# Patient Record
Sex: Female | Born: 1980 | Race: White | Hispanic: No | Marital: Married | Smoking: Former smoker
Health system: Southern US, Community
[De-identification: ages and names within clinical notes are randomized; demographics above are authoritative.]

## PROBLEM LIST (undated history)

## (undated) DIAGNOSIS — J309 Allergic rhinitis, unspecified: Secondary | ICD-10-CM

## (undated) DIAGNOSIS — K219 Gastro-esophageal reflux disease without esophagitis: Secondary | ICD-10-CM

## (undated) DIAGNOSIS — M62830 Muscle spasm of back: Secondary | ICD-10-CM

## (undated) DIAGNOSIS — I493 Ventricular premature depolarization: Secondary | ICD-10-CM

## (undated) DIAGNOSIS — R200 Anesthesia of skin: Secondary | ICD-10-CM

## (undated) DIAGNOSIS — R7303 Prediabetes: Secondary | ICD-10-CM

## (undated) DIAGNOSIS — N6019 Diffuse cystic mastopathy of unspecified breast: Secondary | ICD-10-CM

## (undated) DIAGNOSIS — R030 Elevated blood-pressure reading, without diagnosis of hypertension: Secondary | ICD-10-CM

## (undated) DIAGNOSIS — Z9289 Personal history of other medical treatment: Secondary | ICD-10-CM

## (undated) DIAGNOSIS — F418 Other specified anxiety disorders: Secondary | ICD-10-CM

## (undated) DIAGNOSIS — R202 Paresthesia of skin: Secondary | ICD-10-CM

## (undated) DIAGNOSIS — E669 Obesity, unspecified: Secondary | ICD-10-CM

## (undated) DIAGNOSIS — F410 Panic disorder [episodic paroxysmal anxiety] without agoraphobia: Secondary | ICD-10-CM

## (undated) DIAGNOSIS — J45909 Unspecified asthma, uncomplicated: Secondary | ICD-10-CM

## (undated) DIAGNOSIS — Z90710 Acquired absence of both cervix and uterus: Secondary | ICD-10-CM

## (undated) DIAGNOSIS — E785 Hyperlipidemia, unspecified: Secondary | ICD-10-CM

## (undated) HISTORY — DX: Gastro-esophageal reflux disease without esophagitis: K21.9

## (undated) HISTORY — DX: Ventricular premature depolarization: I49.3

## (undated) HISTORY — DX: Obesity, unspecified: E66.9

## (undated) HISTORY — DX: Allergic rhinitis, unspecified: J30.9

## (undated) HISTORY — DX: Paresthesia of skin: R20.2

## (undated) HISTORY — DX: Prediabetes: R73.03

## (undated) HISTORY — DX: Personal history of other medical treatment: Z92.89

## (undated) HISTORY — DX: Hyperlipidemia, unspecified: E78.5

## (undated) HISTORY — PX: DILATION AND CURETTAGE OF UTERUS: SHX78

## (undated) HISTORY — DX: Unspecified asthma, uncomplicated: J45.909

## (undated) HISTORY — DX: Anesthesia of skin: R20.0

## (undated) HISTORY — DX: Acquired absence of both cervix and uterus: Z90.710

## (undated) HISTORY — DX: Diffuse cystic mastopathy of unspecified breast: N60.19

## (undated) HISTORY — DX: Panic disorder (episodic paroxysmal anxiety): F41.0

## (undated) HISTORY — DX: Other specified anxiety disorders: F41.8

## (undated) HISTORY — DX: Elevated blood-pressure reading, without diagnosis of hypertension: R03.0

## (undated) HISTORY — PX: ABDOMINAL HYSTERECTOMY: SHX81

## (undated) HISTORY — DX: Muscle spasm of back: M62.830

---

## 1999-06-25 ENCOUNTER — Inpatient Hospital Stay (HOSPITAL_COMMUNITY): Admission: AD | Admit: 1999-06-25 | Discharge: 1999-06-27 | Payer: Self-pay | Admitting: Obstetrics and Gynecology

## 1999-07-30 ENCOUNTER — Other Ambulatory Visit: Admission: RE | Admit: 1999-07-30 | Discharge: 1999-07-30 | Payer: Self-pay | Admitting: Obstetrics and Gynecology

## 2000-05-30 ENCOUNTER — Other Ambulatory Visit: Admission: RE | Admit: 2000-05-30 | Discharge: 2000-05-30 | Payer: Self-pay | Admitting: Obstetrics & Gynecology

## 2000-08-16 ENCOUNTER — Ambulatory Visit (HOSPITAL_COMMUNITY): Admission: RE | Admit: 2000-08-16 | Discharge: 2000-08-16 | Payer: Self-pay | Admitting: *Deleted

## 2000-12-14 ENCOUNTER — Inpatient Hospital Stay (HOSPITAL_COMMUNITY): Admission: AD | Admit: 2000-12-14 | Discharge: 2000-12-16 | Payer: Self-pay | Admitting: Obstetrics & Gynecology

## 2000-12-31 ENCOUNTER — Inpatient Hospital Stay (HOSPITAL_COMMUNITY): Admission: AD | Admit: 2000-12-31 | Discharge: 2000-12-31 | Payer: Self-pay | Admitting: Obstetrics and Gynecology

## 2001-01-13 ENCOUNTER — Other Ambulatory Visit: Admission: RE | Admit: 2001-01-13 | Discharge: 2001-01-13 | Payer: Self-pay | Admitting: Obstetrics & Gynecology

## 2001-05-02 ENCOUNTER — Emergency Department (HOSPITAL_COMMUNITY): Admission: EM | Admit: 2001-05-02 | Discharge: 2001-05-02 | Payer: Self-pay | Admitting: Emergency Medicine

## 2003-03-22 ENCOUNTER — Encounter: Payer: Self-pay | Admitting: Obstetrics and Gynecology

## 2003-03-22 ENCOUNTER — Encounter: Admission: RE | Admit: 2003-03-22 | Discharge: 2003-03-22 | Payer: Self-pay | Admitting: Obstetrics and Gynecology

## 2003-07-26 ENCOUNTER — Emergency Department (HOSPITAL_COMMUNITY): Admission: EM | Admit: 2003-07-26 | Discharge: 2003-07-26 | Payer: Self-pay | Admitting: Emergency Medicine

## 2003-07-26 ENCOUNTER — Encounter: Payer: Self-pay | Admitting: Emergency Medicine

## 2004-01-26 ENCOUNTER — Emergency Department (HOSPITAL_COMMUNITY): Admission: EM | Admit: 2004-01-26 | Discharge: 2004-01-26 | Payer: Self-pay | Admitting: Emergency Medicine

## 2004-10-09 ENCOUNTER — Other Ambulatory Visit: Admission: RE | Admit: 2004-10-09 | Discharge: 2004-10-09 | Payer: Self-pay | Admitting: Obstetrics and Gynecology

## 2005-02-01 ENCOUNTER — Emergency Department (HOSPITAL_COMMUNITY): Admission: EM | Admit: 2005-02-01 | Discharge: 2005-02-01 | Payer: Self-pay | Admitting: Emergency Medicine

## 2005-04-29 ENCOUNTER — Ambulatory Visit (HOSPITAL_COMMUNITY): Admission: RE | Admit: 2005-04-29 | Discharge: 2005-04-29 | Payer: Self-pay | Admitting: Obstetrics and Gynecology

## 2005-04-29 ENCOUNTER — Encounter (INDEPENDENT_AMBULATORY_CARE_PROVIDER_SITE_OTHER): Payer: Self-pay | Admitting: *Deleted

## 2005-07-28 ENCOUNTER — Encounter: Admission: RE | Admit: 2005-07-28 | Discharge: 2005-07-28 | Payer: Self-pay | Admitting: Family Medicine

## 2006-04-24 ENCOUNTER — Emergency Department (HOSPITAL_COMMUNITY): Admission: EM | Admit: 2006-04-24 | Discharge: 2006-04-24 | Payer: Self-pay | Admitting: Emergency Medicine

## 2006-06-03 ENCOUNTER — Other Ambulatory Visit: Admission: RE | Admit: 2006-06-03 | Discharge: 2006-06-03 | Payer: Self-pay | Admitting: Obstetrics and Gynecology

## 2006-06-21 ENCOUNTER — Ambulatory Visit (HOSPITAL_COMMUNITY): Admission: RE | Admit: 2006-06-21 | Discharge: 2006-06-21 | Payer: Self-pay | Admitting: Family Medicine

## 2007-03-08 ENCOUNTER — Encounter: Admission: RE | Admit: 2007-03-08 | Discharge: 2007-03-08 | Payer: Self-pay | Admitting: Family Medicine

## 2007-03-21 DIAGNOSIS — I493 Ventricular premature depolarization: Secondary | ICD-10-CM

## 2007-03-21 HISTORY — DX: Ventricular premature depolarization: I49.3

## 2007-07-20 ENCOUNTER — Ambulatory Visit: Payer: Self-pay | Admitting: Family Medicine

## 2007-09-08 ENCOUNTER — Encounter (INDEPENDENT_AMBULATORY_CARE_PROVIDER_SITE_OTHER): Payer: Self-pay | Admitting: Obstetrics and Gynecology

## 2007-09-08 ENCOUNTER — Ambulatory Visit (HOSPITAL_COMMUNITY): Admission: RE | Admit: 2007-09-08 | Discharge: 2007-09-08 | Payer: Self-pay | Admitting: Obstetrics and Gynecology

## 2008-03-13 ENCOUNTER — Ambulatory Visit (HOSPITAL_BASED_OUTPATIENT_CLINIC_OR_DEPARTMENT_OTHER): Admission: RE | Admit: 2008-03-13 | Discharge: 2008-03-13 | Payer: Self-pay | Admitting: Family Medicine

## 2008-03-25 ENCOUNTER — Emergency Department (HOSPITAL_COMMUNITY): Admission: EM | Admit: 2008-03-25 | Discharge: 2008-03-25 | Payer: Self-pay | Admitting: Emergency Medicine

## 2008-03-26 ENCOUNTER — Ambulatory Visit: Payer: Self-pay | Admitting: Internal Medicine

## 2008-04-22 ENCOUNTER — Ambulatory Visit (HOSPITAL_COMMUNITY): Admission: RE | Admit: 2008-04-22 | Discharge: 2008-04-23 | Payer: Self-pay | Admitting: Obstetrics and Gynecology

## 2008-04-22 ENCOUNTER — Encounter (INDEPENDENT_AMBULATORY_CARE_PROVIDER_SITE_OTHER): Payer: Self-pay | Admitting: Obstetrics and Gynecology

## 2009-07-19 ENCOUNTER — Emergency Department (HOSPITAL_COMMUNITY): Admission: EM | Admit: 2009-07-19 | Discharge: 2009-07-19 | Payer: Self-pay | Admitting: Emergency Medicine

## 2009-07-21 ENCOUNTER — Emergency Department (HOSPITAL_COMMUNITY): Admission: EM | Admit: 2009-07-21 | Discharge: 2009-07-21 | Payer: Self-pay | Admitting: Emergency Medicine

## 2009-11-27 ENCOUNTER — Emergency Department (HOSPITAL_COMMUNITY): Admission: EM | Admit: 2009-11-27 | Discharge: 2009-11-27 | Payer: Self-pay | Admitting: Emergency Medicine

## 2010-02-09 ENCOUNTER — Emergency Department (HOSPITAL_COMMUNITY): Admission: EM | Admit: 2010-02-09 | Discharge: 2010-02-09 | Payer: Self-pay | Admitting: Emergency Medicine

## 2010-07-20 ENCOUNTER — Emergency Department (HOSPITAL_COMMUNITY): Admission: EM | Admit: 2010-07-20 | Discharge: 2010-07-20 | Payer: Self-pay | Admitting: Emergency Medicine

## 2010-07-28 ENCOUNTER — Encounter: Admission: RE | Admit: 2010-07-28 | Discharge: 2010-09-04 | Payer: Self-pay | Admitting: Family Medicine

## 2010-08-26 ENCOUNTER — Encounter: Admission: RE | Admit: 2010-08-26 | Discharge: 2010-08-26 | Payer: Self-pay | Admitting: Obstetrics and Gynecology

## 2010-12-18 ENCOUNTER — Emergency Department (HOSPITAL_COMMUNITY)
Admission: EM | Admit: 2010-12-18 | Discharge: 2010-12-18 | Disposition: A | Discharge status: Home / Self Care | Payer: Self-pay | Attending: Emergency Medicine | Admitting: Emergency Medicine

## 2010-12-18 ENCOUNTER — Emergency Department (HOSPITAL_COMMUNITY): Payer: Self-pay

## 2010-12-18 DIAGNOSIS — F17200 Nicotine dependence, unspecified, uncomplicated: Secondary | ICD-10-CM | POA: Insufficient documentation

## 2010-12-18 DIAGNOSIS — G43909 Migraine, unspecified, not intractable, without status migrainosus: Secondary | ICD-10-CM | POA: Insufficient documentation

## 2010-12-18 DIAGNOSIS — H5460 Unqualified visual loss, one eye, unspecified: Secondary | ICD-10-CM | POA: Insufficient documentation

## 2010-12-18 DIAGNOSIS — Z79899 Other long term (current) drug therapy: Secondary | ICD-10-CM | POA: Insufficient documentation

## 2010-12-18 DIAGNOSIS — F411 Generalized anxiety disorder: Secondary | ICD-10-CM | POA: Insufficient documentation

## 2010-12-18 LAB — BASIC METABOLIC PANEL
CO2: 27 mEq/L (ref 19–32)
Chloride: 105 mEq/L (ref 96–112)
GFR calc Af Amer: >60 mL/min (ref >60)
Potassium: 3.7 mEq/L (ref 3.5–5.1)
Sodium: 140 mEq/L (ref 135–145)

## 2011-01-20 LAB — CBC
HCT: 41.3 % (ref 36.0–46.0)
Hemoglobin: 13.9 g/dL (ref 12.0–15.0)
MCHC: 33.7 g/dL (ref 30.0–36.0)
MCV: 89.4 fL (ref 78.0–100.0)
RBC: 4.62 MIL/uL (ref 3.87–5.11)
WBC: 8.5 10*3/uL (ref 4.0–10.5)

## 2011-01-20 LAB — COMPREHENSIVE METABOLIC PANEL
AST: 19 U/L (ref 0–37)
BUN: 8 mg/dL (ref 6–23)
CO2: 24 mEq/L (ref 19–32)
Calcium: 9 mg/dL (ref 8.4–10.5)
Chloride: 106 mEq/L (ref 96–112)
Creatinine, Ser: 0.72 mg/dL (ref 0.4–1.2)
GFR calc Af Amer: >60 mL/min (ref >60)
GFR calc non Af Amer: >60 mL/min (ref >60)
Glucose, Bld: 84 mg/dL (ref 70–99)
Total Bilirubin: 0.5 mg/dL (ref 0.3–1.2)

## 2011-01-20 LAB — DIFFERENTIAL
Basophils Absolute: 0.1 10*3/uL (ref 0.0–0.1)
Eosinophils Absolute: 0.2 10*3/uL (ref 0.0–0.7)
Eosinophils Relative: 3 % (ref 0–5)
Lymphocytes Relative: 30 % (ref 12–46)
Neutrophils Relative %: 60 % (ref 43–77)

## 2011-01-20 LAB — URINALYSIS, ROUTINE W REFLEX MICROSCOPIC
Bilirubin Urine: NEGATIVE
Hgb urine dipstick: NEGATIVE
Ketones, ur: NEGATIVE mg/dL
Specific Gravity, Urine: 1.01 (ref 1.005–1.030)
pH: 7.5 (ref 5.0–8.0)

## 2011-01-20 LAB — POCT CARDIAC MARKERS: CKMB, poc: <1 ng/mL — ABNORMAL LOW (ref 1.0–8.0)

## 2011-01-20 LAB — LIPASE, BLOOD: Lipase: 33 U/L (ref 11–59)

## 2011-02-05 LAB — CBC
Platelets: 182 10*3/uL (ref 150–400)
WBC: 9.5 10*3/uL (ref 4.0–10.5)

## 2011-02-05 LAB — URINALYSIS, ROUTINE W REFLEX MICROSCOPIC
Glucose, UA: NEGATIVE mg/dL
Hgb urine dipstick: NEGATIVE
Specific Gravity, Urine: 1.025 (ref 1.005–1.030)

## 2011-02-05 LAB — URINE MICROSCOPIC-ADD ON

## 2011-02-05 LAB — DIFFERENTIAL
Eosinophils Absolute: 0.4 10*3/uL (ref 0.0–0.7)
Lymphocytes Relative: 19 % (ref 12–46)
Lymphs Abs: 1.8 10*3/uL (ref 0.7–4.0)
Neutro Abs: 6.6 10*3/uL (ref 1.7–7.7)
Neutrophils Relative %: 69 % (ref 43–77)

## 2011-02-05 LAB — D-DIMER, QUANTITATIVE: D-Dimer, Quant: 0.22 ug/mL-FEU (ref 0.00–0.48)

## 2011-03-03 ENCOUNTER — Ambulatory Visit: Payer: Self-pay | Admitting: Ophthalmology

## 2011-03-16 NOTE — H&P (Signed)
Belinda [name], [name] NO.:  [id number]   MEDICAL RECORD NO.:  [id number]           PATIENT TYPE:   LOCATION:                                 FACILITY:   PHYSICIAN:  Sherron Monday, MD        DATE OF BIRTH:  July 31, 1981   DATE OF ADMISSION:  DATE OF DISCHARGE:                              HISTORY & PHYSICAL   PLANNED PROCEDURE:  Total vaginal hysterectomy, as well as TVT Secur.   HISTORY OF PRESENT ILLNESS:  This is a 30 year old female G3, P2 who  presents with continuing complaints of menorrhagia and breast pain with  her menstrual cycle, as well as diffuse pelvic pain who is status post  NovaSure endometrial ablation and diagnostic laparoscopy in November  2008, with continued complaints and problems, as well as complaints of  stress urinary incontinence with leaking with cough, sneeze, and  laughter.   PAST MEDICAL HISTORY:  Significant for depression.   PAST SURGICAL HISTORY:  Significant for two diagnostic laparoscopies,  D&C, and NovaSure endometrial ablation.   MEDICATION:  Citalopram daily for depression.   ALLERGIES:  No known drug allergies.   SOCIAL HISTORY:  She admits a pack a day tobacco use.  No other alcohol  or drugs.  She is married and works as a Copy.   PAST OB/GYN HISTORY:  No history of any abnormal Pap smears.  The last  was performed in 2008 and was within normal limits.  Endometrial biopsy  was performed and was found to be benign prior to her NovaSure.  No  history of any sexually transmitted diseases.  She is sexually active  and monogamous with her husband who has a vasectomy for contraception.  G1 was therapeutic abortion,  G2 was a term delivery of a female infant  weighing 7 pounds 14 ounces.   FAMILY HISTORY:  Significant for diabetes in maternal uncle,  hypertension in father and sister, lung cancer in uncle and breast  cancer in maternal grandmother.  We discussed with the patient the  failure of NovaSure endometrial  ablation and she desires a hysterectomy  with an understanding that she will no longer be able to become  pregnant.  States that the bleeding is interfering with her life.  She  also complains of stress urinary incontinence.  This was evaluated by  cystometric which revealed a stress urinary incontinence with  provocation.  So she voiced understanding to all this and we will  proceed on April 22, 2008.   PHYSICAL EXAMINATION:  She is afebrile with stable vital signs.  GENERAL:  No apparent distress.  CARDIOVASCULAR:  Regular rate and rhythm.  LUNGS:  Clear to auscultation bilaterally.  ABDOMEN:  Soft, nontender, nondistended with positive bowel sounds.  EXTREMITIES:  Symmetric and nontender.  NECK:  No lymphadenopathy.  Thyroid within normal limits.  HEENT: Mucous membranes are moist.  BREAST:  Reveals D cup, no masses.  Symmetric and nontender.  PELVIC:  Normal external female genitalia, normal Bartholin, urethral,  and Skene glands, good support.  Cervix and vagina without lesions.  No  cervical motion tenderness.  Difficult to appreciate secondary to her  habitus with a normal uterus and adnexa, no masses and nontender.  We  will proceed with the surgery on April 22, 2008.   ASSESSMENT/PLAN:  A 30 year old Caucasian female, G3, P2 with stress  urinary incontinence and complaints of menorrhagia who wishes to proceed  with definitive management and will undergo the surgery after discussion  of risks, benefits and alternatives of the hysterectomy, as well as the  TVT Secur.      Sherron Monday, MD  Electronically Signed     JB/MEDQ  D:  04/19/2008  T:  04/20/2008  Job:  [id number]

## 2011-03-16 NOTE — Discharge Summary (Signed)
Belinda [name], Belinda [name]                   ACCOUNT NO.:  [id number]   MEDICAL RECORD NO.:  [id number]          PATIENT TYPE:  OIB   LOCATION:  9319                          FACILITY:  [facility]   PHYSICIAN:  Sherron Monday, MD        DATE OF BIRTH:  [birth year]   DATE OF ADMISSION:  04/22/2008  DATE OF DISCHARGE:  04/23/2008                               DISCHARGE SUMMARY   ADMISSION DIAGNOSES:  1. Abnormal uterine bleeding.  2. Failed NovaSure endometrial ablation.  3. Stress urinary incontinence.  4. Pelvic pain.   DISCHARGE DIAGNOSES:  1. Abnormal uterine bleeding.  2. Failed NovaSure endometrial ablation.  3. Stress urinary incontinence.  4. Pelvic pain.   PROCEDURE:  1. Total vaginal hysterectomy.  2. Tension-free vaginal tape Secur.  3. Cystoscopy.   HISTORY OF PRESENT ILLNESS:  A 30 year old G3, P2 with continuing  complaints of abnormal uterine bleeding and pain with her menstrual  cycle, status post NovaSure endometrial ablation.  She also has  complaints of stress urinary incontinence which was evaluated with  cystometric in the office and was illustrated in the office.   PAST MEDICAL HISTORY:  Significant for depression.   PAST SURGICAL HISTORY:  Significant for diagnostic laparoscopy, D&C,  NovaSure endometrial ablation.   MEDICATION:  Citalopram.   ALLERGIES:  No known drug allergies.   SOCIAL HISTORY:  She had pack a day tobacco use, alcohol, and drug.  Married and works as a Copy.   PAST OB/GYN HISTORY:  No history of any abnormal Pap smears.  No history  of any sexually transmitted disease.  Endometrial biopsy was performed  and benign prior to her NovaSure.  She was pregnant twice, G1 was a  therapeutic abortion, G2 was a term delivery.   FAMILY HISTORY:  Significant for diabetes, hypertension, lung cancer,  and breast cancer.   PHYSICAL EXAMINATION:  On admission, she is afebrile.  Vital signs are  stable with a benign exam.  She is admitted on April 22, 2008  and  underwent a total vaginal hysterectomy, TVT Secur, and cystoscopy  without complaints.  After, we discussed the risks, benefits, and  alternatives of these surgical procedures as well as possible failure  rate, she was understand to all of this.  She underwent the surgery  without difficulty revealing normal uterus, tubes, and ovaries and an  EBL was approximately 500 mL.  IV fluids 1600 mL.  Her postoperative  course was relatively uncomplicated.  She remained afebrile, vital signs  are stable.  Her hemoglobin decreased from 14.4 to 11.8.  She was able  to ambulate on postoperative day 1, was tolerating p.o., was discharge  to home with routine discharge instructions and numbers to call with any  question or problems.  She will follow up in the office in approximately  2 weeks.  She was given a prescription for Motrin and Vicodin.      Sherron Monday, MD  Electronically Signed     JB/MEDQ  D:  04/23/2008  T:  04/23/2008  Job:  [id number]

## 2011-03-16 NOTE — Op Note (Signed)
Belinda [name], Belinda [name]                   ACCOUNT NO.:  [id number]   MEDICAL RECORD NO.:  [id number]          PATIENT TYPE:  OIB   LOCATION:  9319                          FACILITY:  [facility]   PHYSICIAN:  Sherron Monday, MD        DATE OF BIRTH:  [birth year]   DATE OF PROCEDURE:  04/22/2008  DATE OF DISCHARGE:                               OPERATIVE REPORT   PREOPERATIVE DIAGNOSES:  1. Pelvic pain.  2. Abnormal uterine bleeding.  3. Failed NovaSure endometrial ablation.  4. Stress urinary incontinence.   POSTOPERATIVE DIAGNOSES:  1. Pelvic pain.  2. Abnormal uterine bleeding.  3. Failed NovaSure endometrial ablation.  4. Stress urinary incontinence.   PROCEDURE:  Total vaginal hysterectomy, TVT secure, cystoscopy.   SURGEON:  Sherron Monday, MD   ASSISTANT:  Zenaida Niece, MD   ANESTHESIA:  General.   FINDINGS:  Normal uterus, tubes, and ovaries.   SPECIMEN:  Uterus to pathology.   ESTIMATED BLOOD LOSS:  500 mL   IV FLUIDS:  1600 mL.   URINE OUTPUT:  I&O cath prior to the procedure.   COMPLICATIONS:  None.   The patient sent to the PACU in stable condition.   PROCEDURE:  After informed consent was reviewed with the patient and her  husband including risks, benefits, and alternatives of surgical  procedure, she was transported to the operating room, placed on the  table in a supine position.  General anesthesia was induced and found to  be adequate.  She was then placed in the yellow fin stirrups, prepped  and draped in the normal sterile fashion.  A red rubber catheter was  used sterilely drain her bladder.  Attention was then turned to  performing the vaginal hysterectomy using a heavy weighted speculum and  Deaver retractors, her cervix and vagina were easily visualized.  Her  cervix was circumscribed using Bovie cautery after vasopressin had been  injected.  Using Metzenbaum scissors an attempt was made to enter the  peritoneal cavity anteriorly, this was unsuccessful, so  we entered  posteriorly.  The incision was extended and the banano speculum was  placed.  The uterosacral ligaments was suture ligated bilaterally and  held.  In a stepwise fashion,  The tissue was ligated.  The peritoneum  was entered anteriorly.  The cornu were transected and doubly ligated.  The cuff was closed after bleeding was stopped at the posterior aspect  and noted to be hemostatic.  It was made hemostatic and the cuff was  closed with 2-0 Vicryl in locked fashion.  Attention was then turned to  perform the TVT secure.  Approximately 1-cm incision was made a  centimeter below the urethral meatus after the bladder again had been  drained.  The dissection was performed out to the pubic bone  bilaterally.  The device was placed, cystoscopy was performed to confirm  both ureteral patency and no urethral disruption with TVT secure device,  deployed in the typical fashion and the device was deployed and after  spacing had been noted with the right angle retractor, the  mucosa was  closed with 2-0 in a running locked fashion.  The vagina was packed with  2-inch Estrace-coated strip.  Foley catheter was sterilely placed.  Sponge, lap, and needle counts were correct x2.  The patient tolerated  the procedure well.  She was transferred to the PACU in stable  condition.      Sherron Monday, MD  Electronically Signed     JB/MEDQ  D:  04/22/2008  T:  04/23/2008  Job:  [id number]

## 2011-03-16 NOTE — Procedures (Signed)
NAMELAGUANA, DESAUTEL                   ACCOUNT NO.:  [id number]   MEDICAL RECORD NO.:  [id number]          PATIENT TYPE:  OUT   LOCATION:  SLEEP CENTER                 [facility]ACILITY[facility]  [facility]   PHYSICIAN:  Clinton D. Maple Hudson, MD, FCCP, FACPDATE OF BIRTH:  [birth year] 02, 1981   DATE OF STUDY:  03/13/2008                            NOCTURNAL POLYSOMNOGRAM   REFERRING PHYSICIAN:  Burnell Blanks, MD   INDICATIONS FOR PROCEDURE:  Hypersomnia with sleep apnea.   RESULTS:  Epward sleepiness score 18/24, BMI 37.4, weight 225 pounds,  height 65 inches, neck 16 inches.   HOME MEDICATIONS:  Charted and reviewed.   SLEEP ARCHITECTURE:  Total sleep time 349 minutes with sleep efficiency  86.7%. Stage 1 was 6.4%; Stage 2, 60.2%; Stage 3, 16.2%. REM 17.2% of  total sleep time. Sleep latency 15 minutes, REM latency 92 minutes.  Awake after sleep onset 34 minutes. Arousal index 14.6. Citalopram was  taken at bedtime.   RESPIRATORY DATA:  Apnea/hypopnea index (AHI) 1.7 per hour, which is  normal. Respiratory disturbance index (RDI) 8.1. A total of 10 events  were scored including 1 central apnea and 9 hypopnea's. Events were not  positional. REM AHI 6 per hour. There were insufficient events to permit  CPAP titration by split study protocol on this study night.   OXYGEN DATA:  Moderate snoring with oxygen desaturation to a nadir of  82%. Mean oxygen saturation through the study 95.6% on room air.   CARDIAC DATA:  Occasional PVC.   MOVEMENT/PARASOMNIA:  No significant movement disturbance. Bathroom x1.   IMPRESSION/RECOMMENDATIONS:  1. Occasional respiratory events, within normal limits. AHI 1.7 per      hour (normal range 0 to 5 per hour). Events were non-positional.      Moderate snoring with oxygen desaturation to a nadir of 82%.  2. Scores in this range are not usually considered for CPAP therapy.      Weight loss, treatment of nay significant nasal or upper airway      obstruction, and encouragement to sleep  off flat of back, may be      considered.      Clinton D. Maple Hudson, MD, [facility], FACP  Diplomate, Biomedical engineer of Sleep Medicine  Electronically Signed     CDY/MEDQ  D:  03/23/2008 57:84:69  T:  03/23/2008 23:41:30  Job:  [id number]

## 2011-03-16 NOTE — H&P (Signed)
Belinda [name], Belinda [name]                   ACCOUNT NO.:  [id number]   MEDICAL RECORD NO.:  [id number]          PATIENT TYPE:  AMB   LOCATION:  SDC                           FACILITY:  [facility]   PHYSICIAN:  Sherron Monday, MD        DATE OF BIRTH:  1981/02/27   DATE OF ADMISSION:  09/08/2007  DATE OF DISCHARGE:                              HISTORY & PHYSICAL   PREOPERATIVE DIAGNOSIS:  Menorrhagia, pelvic pain.   PLANNED PROCEDURE:  NovaSure endometrial ablation with hysteroscopy and  a D&C, diagnostic laparoscopy.   HISTORY OF PRESENT ILLNESS:  A 30 year old G3, P2, who presents with  complaints of menorrhagia as well as some breast pain that occurs with  her menstrual cycle.  She also has diffuse pelvic pain.   PAST MEDICAL HISTORY:  Significant for depression.   PAST SURGICAL HISTORY:  Significant for a diagnostic laparoscopy and D&C  performed by Dr. Conley Simmonds.   ALLERGIES:  No known drug allergies.   MEDICATIONS:  Citalopram 20 mg daily for depression.   SOCIAL HISTORY:  She admits to a pack a day of tobacco use.  No alcohol,  no other drugs.  She is married and works as a Copy.   PAST OB/GYN HISTORY:  She has no history of any abnormal Pap smears, the  last of which was performed 8/08 and was found to be within normal  limits.  She had an endometrial biopsy performed and was found to be  benign as well.  No history of any sexually transmitted diseases,  menarche at age 8, cycles every 21 days lasting 6-7 days.  The patient  complains of cramping and pain.  She is sexually active with her husband  and who has a vasectomy which is what they use for contraception.  G1  was a therapeutic abortion.  G2 was a term delivery of a female infant  weighing 6 pounds, 6 ounces.  G2 was a term delivery of a female infant  weighing 7 pounds 14 ounces.   FAMILY HISTORY:  Significant for diabetes in maternal uncle,  hypertension in her father and sister, lung cancer in an uncle, breast  cancer  in maternal grandmother.  No history of infertility or  endometriosis.   She is 5 feet 5 inches tall, weighs 222 pounds, blood pressure 128/70.  GENERAL:  No apparent distress.  CARDIOVASCULAR:  Regular rate and rhythm.  LUNGS:  Clear to auscultation bilaterally.  HEENT:  Mucous membranes are moist.  NECK:  Without lymphadenopathy.  Thyroid is within normal limits.  BACK:  No costovertebral angle tenderness.  BREASTS: C-D cup no masses, nontender, no distortion.  ABDOMEN:  Soft, nontender, nondistended, with positive bowel sounds and  obese.  EXTREMITIES:  Symmetric and nontender.  PELVIC EXAM:  Normal external female genitalia.  Normal Bartholin's,  urethral, Skene's glands.  Cervix and vagina without lesions.  No  cervical motion tenderness.  Normal uterus and adnexal no masses,  nontender.  Exam is limited secondary to her habitus.   Gave her Naprosyn for the pelvic pain and recommended supportive  bras.  She initially tried the NuvaRing for control of her menorrhagia symptoms  but did not feel that this was good treatment for her, secondary to  discomfort and the ring falling out.  Therefore, we discussed the other  options, and she states that she is done with children and childbearing.  Her husband has a vasectomy, and she would like to have the endometrial  ablation performed.  We also discussed with her the option of Mirena IUD  and endometrial biopsy.  She opted for endometrial ablation.  An  endometrial biopsy was performed without difficulty.  The uterus sounded  to 8 cm.  The patient had a fair amount of discomfort with this.  A  saline-infused sonohysterogram was scheduled.  Her uterus was found to  be normal size, normal ovaries bilaterally.  She was unable to tolerate  the saline-infused sonohysterogram, so the decision was made to proceed  with a NovaSure hysteroscopy and D&C.  The patient was also complaining  of some symptoms of nocturia and dyspareunia.  Discussed  with her the  chances of it being interstitial cystitis.  The patient desired to  address these primary concerns prior to her concern of menorrhagia prior  to adjusting this.  We also discussed her pelvic pain, at the time of  her ablation we will perform a laparoscopy to see if she has adhesions  or endometriosis.   ASSESSMENT AND PLAN:  Discussed with the patient risks, benefits, and  alternatives to surgical procedure including bleeding, infection, damage  to her uterus, chance of continued heavy vaginal bleeding.  She voices  understanding to this, understands that her fertility is compromised and  if she has future partners, she will need to use contraception.  She  voices understanding to all this and wishes to proceed with a NovaSure  endometrial ablation. We also discussed risks with laparoscopy including  bleeding, infection, damage to surrounding organs (bowels, bladder,  ureters, blood vessels), trouble healing, continued pelvic pain.  Questions are answered, we will proceed. This will be performed on  September 08, 2007.      Sherron Monday, MD  Electronically Signed     JB/MEDQ  D:  09/07/2007  T:  09/07/2007  Job:  [id number]

## 2011-03-16 NOTE — Op Note (Signed)
[name], [name]                   ACCOUNT NO.:  [id number]   MEDICAL RECORD NO.:  [id number]          PATIENT TYPE:  AMB   LOCATION:  SDC                           FACILITY:  [facility]   PHYSICIAN:  Sherron Monday, MD        DATE OF BIRTH:  15-Feb-1981   DATE OF PROCEDURE:  09/08/2007  DATE OF DISCHARGE:                               OPERATIVE REPORT   PREOPERATIVE DIAGNOSES:  1. Menorrhagia.  2. Pelvic pain.   POSTOPERATIVE DIAGNOSES:  1. Menorrhagia.  2. Pelvic pain.   PROCEDURES:  1. Diagnostic laparoscopy.  2. Hysteroscopy.  3. Dilatation and curettage.  4. NovaSure endometrial ablation.   SURGEON:  Sherron Monday, MD   ASSISTANT:  None.   ANESTHESIA:  General with 14 mL of local at the umbilicus and cervical  block.   FINDINGS:  The uterus sounds to 8.5 cm, cervical length measured to be  3.5 cm, cavity length 5.0 cm, cavity with 3.4 cm power of 94 watts.  Procedure duration is 1 minute 40 seconds.  Normal uterus, tubes and  ovaries without adhesions or endometriosis noted by laparoscopy.   SPECIMENS:  Endometrial curettings to pathology.   ESTIMATED BLOOD LOSS:  Minimal.   IV FLUIDS:  800 mL.   URINE OUTPUT:  I&O catheter at the start of the procedure.   COMPLICATIONS:  None.   DISPOSITION:  Stable to PACU.   PROCEDURE:  After informed consent was reviewed with the patient  including the risks, benefits and alternatives of the surgical procedure  including but not limited to bleeding, infection, damage to her uterus  or the surrounding organs, bowel, bladder, ureters and blood vessels, as  well as difficulty healing, continued pelvic pain and continued heavy  bleeding, she voiced understanding.  She was transferred to the OR,  placed on the table in supine position.  General anesthesia was induced  and found be adequate.  She was then placed in the yellow fin stirrups,  prepped and draped in the normal sterile fashion.  Approximately a 5-mm  infraumbilical incision  was made using a Veress needle.  Placement was  confirmed into her abdomen using the hanging drop test and the abdomen  was then insufflated.  Using direct entry, a 5-mm trocar was placed.  A  pelvic survey was performed revealing a normal uterus, normal tubes,  normal ovaries.  Inspection was performed in the posterior and anterior  cul-de-sacs and the ovarian fossa.  No endometriosis or adhesions were  noted.  Gas was evacuated from her abdomen.  The incision was closed  with 4-0 Vicryl.  Attention was then turned to the vaginal portion of  the case.   Prior to above, her  bladder was sterilely drained and a Hulka uterine  manipulator was placed.  The Hulka manipulator was removed and a single-  tooth tenaculum was placed on her cervix.  Her uterus sounded to 8.5 cm.  After the uterus was sounded, it was dilated to accommodate the  hysteroscope.  The hysteroscope was placed in the uterus.  The uterine  survey  was performed revealing fluffy endometrium.  D&C was performed,  sent to pathology.  The cervical length was found to be 3.5 cm and using  the NovaSure endometrial ablation device, cavity width was calculated to  be 3.4 cm.  A cavity assessment was performed and no leak was found.  The procedure was then performed at 94 watts for 1 minute 40 seconds.  The patient tolerated the procedure well.  After the procedure her  bleeding was noted to be within normal limits.  The single-tooth  tenaculum was removed.  Instruments were removed from her vagina.  She  was returned to the supine position, awake and in stable condition,  tolerated the procedure well.  Sponge, lap and needle counts were  correct x2 at the end of the procedure.      Sherron Monday, MD  Electronically Signed     JB/MEDQ  D:  09/08/2007  T:  09/09/2007  Job:  [id number]

## 2011-03-19 NOTE — Op Note (Signed)
NAMESHELBYLYNN, WALCZYK NO.:  [id number]   MEDICAL RECORD NO.:  [id number]          PATIENT TYPE:  AMB   LOCATION:  SDC                           FACILITY:  [facility]   PHYSICIAN:  Randye Lobo, M.D.   DATE OF BIRTH:  07-03-1981   DATE OF PROCEDURE:  04/29/2005  DATE OF DISCHARGE:                                 OPERATIVE REPORT   PREOPERATIVE DIAGNOSES:  1.  Pelvic pain.  2.  Dyspareunia.  3.  Menorrhagia.   POSTOPERATIVE DIAGNOSES:  1.  Pelvic pain.  2.  Dyspareunia.  3.  Menorrhagia.   PROCEDURE:  Diagnostic hysteroscopy, dilation and curettage, diagnostic  laparoscopy.   SURGEON:  Conley Simmonds, MD   ANESTHESIA:  General endotracheal, local with 0.25% Marcaine, paracervical  block with 1% lidocaine.   IV FLUIDS:  600 mL Ringer's lactate.   ESTIMATED BLOOD LOSS:  Minimal.   URINE OUTPUT:  10 mL.   COMPLICATIONS:  None.   INDICATIONS FOR PROCEDURE:  The patient is a 30 year old para 2 Caucasian  female with a long history of pelvic pain, painful intercourse, and  dysmenorrhea, and heavy menses. The patient has taken progesterone birth  control pills in the past and has discontinued this due to tachycardia and  left arm numbness. She declined any hormonal treatment for her symptoms  because of this. A pelvic ultrasound October 21, 2004 documented a normal  uterus with an endometrial thickness of 0.89 cm. The patient had normal  right and left ovaries. A gonorrhea and Chlamydia probe on April 02, 2005 was  a negative for infectious disease. The patient wishes for surgical  evaluation and treatment of her symptoms which are progressive and a plan is  made to proceed with a hysteroscopy, D&C and a laparoscopy with possible  lysis of adhesions and possible treatment of endometriosis after risks,  benefits, and alternatives were discussed with her.   FINDINGS:  Hysteroscopy demonstrated a very fluffy and thickened  endometrium. No specific isolated lesions  were visible during the  hysteroscopy, although the visualization was suboptimal due to clotting and  thickened endometrium. The sorbitol deficit was noted to be 0 mL.   At the time of laparoscopy, the patient was noted to have a normal uterus,  tubes, and ovaries. The gallbladder and liver were unremarkable. The  appendix could not be well visualized and was thought to be retrocecal.  There was no sign of any endometriosis nor adhesive disease throughout the  abdomen or pelvis.   SPECIMENS:  Endometrial curettings were sent to pathology.   PROCEDURE:  The patient was reidentified in the preoperative hold area and  was taken down to the operating room where general endotracheal anesthesia  was induced. She was placed in the dorsal lithotomy position and the abdomen  and vagina were then sterilely prepped and draped.   A Foley catheter was placed inside the bladder and a speculum was then  placed inside the vagina and a single-tooth tenaculum was placed on the  anterior cervical lip. A paracervical block was then performed in standard  fashion with 11 mL of 1% lidocaine. The uterus was then sounded to 8 cm and  the cervix was then serially dilated to a #21 Pratt dilator. The  hysteroscope was then inserted through the cervix into the uterine cavity  under the continuous infusion of sorbitol solution. The findings were as  noted above. The hysteroscope was withdrawn and a serrated and then a smooth  curette were used to curette the endometrial cavity in all four quadrants.  The hysteroscope was reinserted, and there was still additional fluffy  tissue throughout and so it was therefore withdrawn and one final curettage  of the uterine cavity was performed with the serrated curette. All of the  endometrial curettings were then sent to pathology. The Hulka tenaculum was  then placed in the uterus and the single-tooth tenaculum was removed.   The laparoscopy was performed next. An Allis  clamp was used to elevate the  umbilicus and a 1 cm infraumbilical incision was then created with a  scalpel. This was carried down to the fascia using an Allis clamp. A 10 mm  trocar was then inserted directly into the peritoneal cavity in the  laparoscope confirmed proper placement. The pneumoperitoneum was achieved  and the patient was placed in the Trendelenburg position. A 5 mm incision  was then created in the lower abdomen, and under direct visualization of the  laparoscope, a 5 mm trocar was placed.  An inspection of the pelvic and  abdominal organs was performed and the findings are as noted above.   This concluded the patient's laparoscopy. The trocars were removed under  direct visualization of the laparoscope. The pneumoperitoneum was completely  released and the 10 mm umbilical trocar and laparoscope were removed  simultaneously. The skin incisions were closed with subcuticular sutures of  3-0 plain. Steri-Strips were placed over the incisions after the skin was  cleansed of Betadine. All of the instruments were removed from the vagina  and the bladder.   The patient was awakened, extubated, and escorted to the recovery room in  stable condition. All needle, instrument, and sponge counts were correct.      Randye Lobo, M.D.  Electronically Signed     BES/MEDQ  D:  04/29/2005  T:  04/29/2005  Job:  [id number]

## 2011-03-19 NOTE — H&P (Signed)
[facility]  Patient:    Belinda [name], Belinda [name]                          MRN: [id number] Adm. Date:  [id number] Attending:  Genia Del                         History and Physical  DATE OF BIRTH:                [birth year]  CHIEF COMPLAINT:              Belinda [name] is a 30 year old G 3, P 1, A 1, with last menstrual period of Mar 24, 2000, for an expected date of delivery by the last menstrual period of December 04, 2000, but an ultrasound did not correspond to the expected date of delivery.  The expected date of delivery by ultrasound is December 18, 2000, at 39 weeks and 3 days gestation.  REASON FOR ADMISSION:         Regular uterine contractions every 3-5 minutes x 12 p.m. on December 14, 2000.  HISTORY OF PRESENT ILLNESS:   The patient felt increasing uterine contractions since this morning around 8 a.m.  They became every three to five minutes at 12 p.m.  No vaginal bleeding.  No fluid leak.  Good fetal movements.  No PIH symptoms.  PAST MEDICAL HISTORY:         Negative.  PAST SURGICAL HISTORY:        Positive for a D&E in 1996, at eight weeks gestation, with no complications.  Removal of a cyst in August 1999.  PAST OBSTETRICAL HISTORY:     August 2000, 38 weeks, spontaneous vaginal delivery, a baby girl, with no complications weighing 6 pounds 6 ounces.  FAMILY HISTORY:               Positive for a myocardial infarction in the father, and bypass surgery.  A sister with chronic hypertension.  No breast cancer.  No gynecological cancer.  SOCIAL HISTORY:               The patient is married.  She is a smoker.  CURRENT MEDICATIONS:          Prenatal vitamins.  ALLERGIES:                    No known drug allergies, but allergy to [facility]  HISTORY OF PRESENT PREGNANCY: First trimester was unremarkable except for very mild spotting.  An ultrasound was done revealing an intrauterine pregnancy 11 weeks and 4 days gestation, with an  expected date of delivery of December 18, 2000.  In the second trimester she had a triple test which was within normal limits.  Her ultrasound revealed anatomy at 21+ weeks, showing normal amniotic fluid, normal anterior placenta.  The cervix at 3.3 cm, and the review of the anatomy was within normal limits.  In the third trimester she consulted with orthopedics.  The diagnosis of arthritis in both knees.  Because of leg pains, a duplex scan was done.  No deep vein thrombosis was seen.  She had a one-hour glucose test at 27+ weeks, which came back normal.  Hemoglobin was stable at 12.3.  Group-B Streptococcus was negative at 34+ weeks.  The remainder of the third trimester was within normal limits with normal blood pressures throughout.  LABORATORY DATA:  In the first trimester the labs showed hemoglobin 13.6, platelets 163, blood type Rh, A-positive, Rh antibodies negative.  RPR nonreactive.  HbsAg negative.  HIV nonreactive.  Rubella immune.  Pap test was within normal limits.   Gonorrhea and Chlamydia were negative.  REVIEW OF SYSTEMS:            CONSTITUTIONAL:  Negative.  HEENT:  Within normal limits.  RESPIRATORY:  Negative.  CARDIOVASCULAR:  Negative.  GI: Negative.  UROLOGY:  Negative.  DERMATOLOGIC:  Negative.  NEUROLOGIC: Negative.  PHYSICAL EXAMINATION:  GENERAL:                      No apparent distress except for pain with uterine contractions.  VITAL SIGNS:                  Blood pressure at first was 143/83, and then was lower at around 120/70.  Respirations 20, pulse 93, temperature 97 degrees.  HEENT:                        Within normal limits.  LUNGS:                        Clear bilaterally.  HEART:                        Regular cardiac rhythm, no murmur.  ABDOMEN:                      Gravid, uterine height corresponds, cephalic presentation.  PELVIC:                       Vaginal examination on arrival was 3.0 cm, 100% effaced, vertex -2,  intact membranes.  EXTREMITIES:                  Lower limbs with very mild edema.  MONITORING:                   Uterine contractions every two to four minutes, moderate in intensity, lasting for 60-80 seconds.  Fetal heart rate measuring 140-150 per minute.  good accelerations, good variability.  Occasional mild variables.  IMPRESSION:                   A gravida 3, para 1, abortion 1 at 39 weeks and                               three weeks gestation, in active labor, with                               reassuring fetal well-being, and group-B                               Streptococcus negative.  PLAN:                         Admission to labor and delivery.  Monitoring, expectant management towards a probable vaginal delivery. DD:  12/14/00 TD:  12/14/00 Job: [id number]

## 2011-07-29 LAB — CBC
HCT: 43.2
Hemoglobin: 14.4
MCHC: 33.4
MCHC: 34
MCV: 87.8
MCV: 87.8
Platelets: 169
RBC: 3.96
RDW: 15.3
RDW: 15.4

## 2011-07-29 LAB — BASIC METABOLIC PANEL
CO2: 28
Chloride: 102
Glucose, Bld: 94
Potassium: 4.1
Sodium: 137

## 2011-08-11 LAB — CBC
MCV: 83.1
Platelets: 166
WBC: 7.7

## 2011-08-11 LAB — PREGNANCY, URINE: Preg Test, Ur: NEGATIVE

## 2012-08-05 ENCOUNTER — Emergency Department (HOSPITAL_COMMUNITY)
Admission: EM | Admit: 2012-08-05 | Discharge: 2012-08-05 | Disposition: A | Discharge status: Home / Self Care | Payer: Managed Care, Other (non HMO) | Attending: Emergency Medicine | Admitting: Emergency Medicine

## 2012-08-05 ENCOUNTER — Emergency Department (HOSPITAL_COMMUNITY): Payer: Managed Care, Other (non HMO)

## 2012-08-05 ENCOUNTER — Encounter (HOSPITAL_COMMUNITY): Payer: Self-pay | Admitting: Emergency Medicine

## 2012-08-05 DIAGNOSIS — K5900 Constipation, unspecified: Secondary | ICD-10-CM | POA: Insufficient documentation

## 2012-08-05 LAB — BASIC METABOLIC PANEL
BUN: 13 mg/dL (ref 6–23)
Creatinine, Ser: 0.77 mg/dL (ref 0.50–1.10)
GFR calc Af Amer: >90 mL/min (ref >90)
GFR calc non Af Amer: >90 mL/min (ref >90)

## 2012-08-05 LAB — CBC WITH DIFFERENTIAL/PLATELET
Basophils Relative: 0 % (ref 0–1)
Eosinophils Absolute: 0.3 10*3/uL (ref 0.0–0.7)
Eosinophils Relative: 3 % (ref 0–5)
HCT: 42 % (ref 36.0–46.0)
Hemoglobin: 14.4 g/dL (ref 12.0–15.0)
MCH: 29.5 pg (ref 26.0–34.0)
MCHC: 34.3 g/dL (ref 30.0–36.0)
Monocytes Absolute: 0.8 10*3/uL (ref 0.1–1.0)
Monocytes Relative: 9 % (ref 3–12)
Neutrophils Relative %: 57 % (ref 43–77)

## 2012-08-05 LAB — URINALYSIS, ROUTINE W REFLEX MICROSCOPIC
Bilirubin Urine: NEGATIVE
Nitrite: POSITIVE — AB
Specific Gravity, Urine: 1.028 (ref 1.005–1.030)
Urobilinogen, UA: 0.2 mg/dL (ref 0.0–1.0)

## 2012-08-05 LAB — URINE MICROSCOPIC-ADD ON

## 2012-08-05 MED ORDER — GI COCKTAIL ~~LOC~~
30.0000 mL | Freq: Once | ORAL | Status: AC
  Administered 2012-08-05: 30 mL via ORAL
  Filled 2012-08-05: qty 30

## 2012-08-05 MED ORDER — POLYETHYLENE GLYCOL 3350 17 GM/SCOOP PO POWD: 17.0000 g | Freq: Every day | ORAL | Status: DC

## 2012-08-05 MED ORDER — SODIUM CHLORIDE 0.9 % IV SOLN: Freq: Once | INTRAVENOUS | Status: DC

## 2012-08-05 NOTE — ED Provider Notes (Signed)
History     CSN: [id number]  Arrival date & time 08/05/12  1750   First MD Initiated Contact with Patient 08/05/12 2057      Chief Complaint  Patient presents with  . Abdominal Pain    (Consider location/radiation/quality/duration/timing/severity/associated sxs/prior treatment) HPI Comments: Patient states, that she's been taking antibiotics for UTI for the last week.  Her dysuria has improved, but she has developed diffuse abdominal pain, with epigastric burning.  She has not tried any over-the-counter medications for her symptoms.  She denies any vaginal discharge.  She states she did take one dose of Diflucan as she normally gets a yeast infection with antibiotic, use.  She has had a hysterectomy  Patient is a 31 y.o. female presenting with abdominal pain. The history is provided by the patient.  Abdominal Pain The primary symptoms of the illness include abdominal pain. The primary symptoms of the illness do not include fever, shortness of breath, nausea, vomiting, diarrhea, dysuria or vaginal discharge. The current episode started 13 to 24 hours ago. The onset of the illness was gradual.  The patient states that she believes she is currently not pregnant. The patient has not had a change in bowel habit.    History reviewed. No pertinent past medical history.  Past Surgical History  Procedure Date  . Abdominal hysterectomy     No family history on file.  History  Substance Use Topics  . Smoking status: Current Every Day Smoker -- 0.5 packs/day    Types: Cigarettes  . Smokeless tobacco: Never Used  . Alcohol Use: No    OB History    Grav Para Term Preterm Abortions TAB SAB Ect Mult Living                  Review of Systems  Constitutional: Negative for fever.  Respiratory: Negative for shortness of breath.   Gastrointestinal: Positive for abdominal pain. Negative for nausea, vomiting and diarrhea.  Genitourinary: Negative for dysuria and vaginal discharge.     Allergies  Review of patient's allergies indicates no known allergies.  Home Medications   Current Outpatient Rx  Name Route Sig Dispense Refill  . CIPROFLOXACIN HCL 500 MG PO TABS Oral Take 500 mg by mouth 2 (two) times daily.    Marland Kitchen FLUOXETINE HCL 20 MG PO CAPS Oral Take 20 mg by mouth daily.    . ADULT MULTIVITAMIN W/MINERALS CH Oral Take 1 tablet by mouth daily.    Marland Kitchen PHENAZOPYRIDINE HCL 200 MG PO TABS Oral Take 200 mg by mouth 3 (three) times daily as needed.    Marland Kitchen POLYETHYLENE GLYCOL 3350 PO POWD Oral Take 17 g by mouth daily. 255 g 0    BP 139/90  Pulse 97  Temp 98.4 F (36.9 C) (Oral)  Resp 18  Ht 5\' 5"  (1.651 m)  Wt 223 lb (101.152 kg)  BMI 37.11 kg/m2  SpO2 97%  Physical Exam  Constitutional: She is oriented to person, place, and time. She appears well-developed and well-nourished.       obese  HENT:  Head: Normocephalic.  Eyes: Pupils are equal, round, and reactive to light.  Neck: Normal range of motion.  Cardiovascular: Normal rate.   Pulmonary/Chest: Effort normal.  Abdominal: Soft. Bowel sounds are normal. She exhibits no distension and no mass. There is generalized tenderness. There is no rebound and no guarding.  Musculoskeletal: Normal range of motion.  Neurological: She is alert and oriented to person, place, and time.  Skin: Skin is  warm and dry.    ED Course  Procedures (including critical care time)  Labs Reviewed  URINALYSIS, ROUTINE W REFLEX MICROSCOPIC - Abnormal; Notable for the following:    Color, Urine AMBER (*)  BIOCHEMICALS MAY BE AFFECTED BY COLOR   Nitrite POSITIVE (*)     All other components within normal limits  POCT PREGNANCY, URINE  URINE MICROSCOPIC-ADD ON  CBC WITH DIFFERENTIAL  BASIC METABOLIC PANEL   Dg Abd Acute W/chest  08/05/2012  *RADIOLOGY REPORT*  Clinical Data: Lower abdominal pain, nausea.  ACUTE ABDOMEN SERIES (ABDOMEN 2 VIEW & CHEST 1 VIEW)  Comparison: Chest x-ray 11/10/2011.  Findings: Moderate stool  throughout the colon. The bowel gas pattern is normal.  There is no evidence of free intraperitoneal air.  No suspicious radio-opaque calculi or other significant radiographic abnormality is seen. Heart size and mediastinal contours are within normal limits.  Both lungs are clear.  IMPRESSION: No acute findings.  Moderate stool burden.   Original Report Authenticated By: Cyndie Chime, M.D.      1. Constipation       MDM   Most likely constipation will verify via acute abdoment        Arman Filter, NP 08/05/12 2234  Arman Filter, NP 08/05/12 2234

## 2012-08-05 NOTE — ED Notes (Signed)
Pt advised the importance of taking medication and eating a balanced diet for colon health and to increase H20 intake.  Pt verbalized understanding

## 2012-08-05 NOTE — ED Notes (Signed)
Pt being treated for UTI dx at urgent care in [place]. Pt is taking abx as directed and drinking plenty of water. Pt initially saw some improvement and is no longer having dysuria. Having severe lower abd pain, unable to obtain position of comfort to sleep, low back, nausea w/o emesis. Has also had mid upper abd pain.

## 2012-08-05 NOTE — ED Notes (Signed)
Pt to radiology.

## 2012-08-05 NOTE — ED Notes (Signed)
Pt refused IV due to personal preference.  PA Dondra Spry made aware, no new orders to follow.

## 2012-08-06 NOTE — ED Provider Notes (Signed)
History/physical exam/procedure(s) were performed by non-physician practitioner and as supervising physician I was immediately available for consultation/collaboration. I have reviewed all notes and am in agreement with care and plan.   Ceclia Koker S Kristopher Delk, MD 08/06/12 1803 

## 2012-12-16 ENCOUNTER — Other Ambulatory Visit: Payer: Self-pay

## 2013-03-04 ENCOUNTER — Emergency Department (HOSPITAL_COMMUNITY)
Admission: EM | Admit: 2013-03-04 | Discharge: 2013-03-05 | Disposition: A | Discharge status: Home / Self Care | Payer: 59 | Attending: Emergency Medicine | Admitting: Emergency Medicine

## 2013-03-04 ENCOUNTER — Encounter (HOSPITAL_COMMUNITY): Payer: Self-pay | Admitting: *Deleted

## 2013-03-04 DIAGNOSIS — Z79899 Other long term (current) drug therapy: Secondary | ICD-10-CM | POA: Insufficient documentation

## 2013-03-04 DIAGNOSIS — R1011 Right upper quadrant pain: Secondary | ICD-10-CM | POA: Insufficient documentation

## 2013-03-04 DIAGNOSIS — R197 Diarrhea, unspecified: Secondary | ICD-10-CM | POA: Insufficient documentation

## 2013-03-04 DIAGNOSIS — F17200 Nicotine dependence, unspecified, uncomplicated: Secondary | ICD-10-CM | POA: Insufficient documentation

## 2013-03-04 DIAGNOSIS — R109 Unspecified abdominal pain: Secondary | ICD-10-CM

## 2013-03-04 DIAGNOSIS — R300 Dysuria: Secondary | ICD-10-CM | POA: Insufficient documentation

## 2013-03-04 LAB — CBC WITH DIFFERENTIAL/PLATELET
Basophils Relative: 1 % (ref 0–1)
Eosinophils Absolute: 0.6 10*3/uL (ref 0.0–0.7)
Eosinophils Relative: 6 % — ABNORMAL HIGH (ref 0–5)
Hemoglobin: 13.4 g/dL (ref 12.0–15.0)
MCH: 29.3 pg (ref 26.0–34.0)
MCHC: 33.5 g/dL (ref 30.0–36.0)
MCV: 87.5 fL (ref 78.0–100.0)
Monocytes Relative: 7 % (ref 3–12)
Neutrophils Relative %: 53 % (ref 43–77)
Platelets: 193 10*3/uL (ref 150–400)

## 2013-03-04 LAB — COMPREHENSIVE METABOLIC PANEL
Albumin: 3.4 g/dL — ABNORMAL LOW (ref 3.5–5.2)
Alkaline Phosphatase: 90 U/L (ref 39–117)
BUN: 8 mg/dL (ref 6–23)
Calcium: 8.7 mg/dL (ref 8.4–10.5)
GFR calc Af Amer: >90 mL/min (ref >90)
Potassium: 4 mEq/L (ref 3.5–5.1)
Total Protein: 6.5 g/dL (ref 6.0–8.3)

## 2013-03-04 LAB — URINALYSIS, ROUTINE W REFLEX MICROSCOPIC
Bilirubin Urine: NEGATIVE
Ketones, ur: NEGATIVE mg/dL
Nitrite: NEGATIVE
Urobilinogen, UA: 1 mg/dL (ref 0.0–1.0)

## 2013-03-04 LAB — LIPASE, BLOOD: Lipase: 57 U/L (ref 11–59)

## 2013-03-04 NOTE — ED Provider Notes (Signed)
History     CSN: [id number]  Arrival date & time 03/04/13  2025   First MD Initiated Contact with Patient 03/04/13 2305      Chief Complaint  Patient presents with  . Abdominal Pain    (Consider location/radiation/quality/duration/timing/severity/associated sxs/prior treatment) HPI Complains of epigastric pain radiating to her right upper quadrant for the past 3 weeks. Pain initially brought on by eating fatty foods now brought on by most foods. ate pizza at 6 PM tonight and a cinnamon roll 45 minutes prior to coming here. Symptoms accompanied by nausea and occasional diarrhea. Pain is mild at present. She's not vomite, no fever no treatment prior to coming here .symptoms improved spontaneously time.  History reviewed. No pertinent past medical history.  Past Surgical History  Procedure Laterality Date  . Abdominal hysterectomy      No family history on file.  History  Substance Use Topics  . Smoking status: Current Every Day Smoker -- 0.50 packs/day    Types: Cigarettes  . Smokeless tobacco: Never Used  . Alcohol Use: No    OB History   Grav Para Term Preterm Abortions TAB SAB Ect Mult Living                  Review of Systems  Constitutional: Negative.   HENT: Negative.   Respiratory: Negative.   Cardiovascular: Negative.   Gastrointestinal: Positive for nausea, abdominal pain and diarrhea.  Genitourinary: Positive for dysuria.       Patient reports she feels as if she doesn't see her bladder completely for the past week  Musculoskeletal: Negative.   Skin: Negative.   Neurological: Negative.   Psychiatric/Behavioral: Negative.   All other systems reviewed and are negative.    Allergies  Review of patient's allergies indicates no known allergies.  Home Medications   Current Outpatient Rx  Name  Route  Sig  Dispense  Refill  . FLUoxetine (PROZAC) 20 MG capsule   Oral   Take 20 mg by mouth daily.           There were no vitals taken for this  visit.  Physical Exam  Nursing note and vitals reviewed. Constitutional: She appears well-developed and well-nourished.  HENT:  Head: Normocephalic and atraumatic.  Eyes: Conjunctivae are normal. Pupils are equal, round, and reactive to light.  Neck: Neck supple. No tracheal deviation present. No thyromegaly present.  Cardiovascular: Normal rate and regular rhythm.   No murmur heard. Pulmonary/Chest: Effort normal and breath sounds normal.  Abdominal: Soft. Bowel sounds are normal. She exhibits no distension and no mass. There is tenderness. There is no rebound and no guarding.  Obese right upper quadrant tenderness negative Murphy sign  Musculoskeletal: Normal range of motion. She exhibits no edema and no tenderness.  Neurological: She is alert. Coordination normal.  Skin: Skin is warm and dry. No rash noted.  Psychiatric: She has a normal mood and affect.    ED Course  Procedures (including critical care time)  Labs Reviewed  CBC WITH DIFFERENTIAL - Abnormal; Notable for the following:    Eosinophils Relative 6 (*)    All other components within normal limits  COMPREHENSIVE METABOLIC PANEL - Abnormal; Notable for the following:    Albumin 3.4 (*)    Total Bilirubin 0.1 (*)    All other components within normal limits  URINALYSIS, ROUTINE W REFLEX MICROSCOPIC  LIPASE, BLOOD   No results found.  Results for orders placed during the hospital encounter of 03/04/13  URINALYSIS,  ROUTINE W REFLEX MICROSCOPIC      Result Value Range   Color, Urine YELLOW  YELLOW   APPearance CLEAR  CLEAR   Specific Gravity, Urine 1.025  1.005 - 1.030   pH 6.0  5.0 - 8.0   Glucose, UA NEGATIVE  NEGATIVE mg/dL   Hgb urine dipstick NEGATIVE  NEGATIVE   Bilirubin Urine NEGATIVE  NEGATIVE   Ketones, ur NEGATIVE  NEGATIVE mg/dL   Protein, ur NEGATIVE  NEGATIVE mg/dL   Urobilinogen, UA 1.0  0.0 - 1.0 mg/dL   Nitrite NEGATIVE  NEGATIVE   Leukocytes, UA NEGATIVE  NEGATIVE  CBC WITH DIFFERENTIAL       Result Value Range   WBC 8.9  4.0 - 10.5 K/uL   RBC 4.57  3.87 - 5.11 MIL/uL   Hemoglobin 13.4  12.0 - 15.0 g/dL   HCT 11.9  14.7 - 82.9 %   MCV 87.5  78.0 - 100.0 fL   MCH 29.3  26.0 - 34.0 pg   MCHC 33.5  30.0 - 36.0 g/dL   RDW 56.2  13.0 - 86.5 %   Platelets 193  150 - 400 K/uL   Neutrophils Relative 53  43 - 77 %   Neutro Abs 4.7  1.7 - 7.7 K/uL   Lymphocytes Relative 33  12 - 46 %   Lymphs Abs 2.9  0.7 - 4.0 K/uL   Monocytes Relative 7  3 - 12 %   Monocytes Absolute 0.7  0.1 - 1.0 K/uL   Eosinophils Relative 6 (*) 0 - 5 %   Eosinophils Absolute 0.6  0.0 - 0.7 K/uL   Basophils Relative 1  0 - 1 %   Basophils Absolute 0.1  0.0 - 0.1 K/uL  COMPREHENSIVE METABOLIC PANEL      Result Value Range   Sodium 138  135 - 145 mEq/L   Potassium 4.0  3.5 - 5.1 mEq/L   Chloride 105  96 - 112 mEq/L   CO2 27  19 - 32 mEq/L   Glucose, Bld 88  70 - 99 mg/dL   BUN 8  6 - 23 mg/dL   Creatinine, Ser 7.84  0.50 - 1.10 mg/dL   Calcium 8.7  8.4 - 69.6 mg/dL   Total Protein 6.5  6.0 - 8.3 g/dL   Albumin 3.4 (*) 3.5 - 5.2 g/dL   AST 14  0 - 37 U/L   ALT 17  0 - 35 U/L   Alkaline Phosphatase 90  39 - 117 U/L   Total Bilirubin 0.1 (*) 0.3 - 1.2 mg/dL   GFR calc non Af Amer >90  >90 mL/min   GFR calc Af Amer >90  >90 mL/min  LIPASE, BLOOD      Result Value Range   Lipase 57  11 - 59 U/L   No results found. Results for orders placed during the hospital encounter of 03/04/13  URINALYSIS, ROUTINE W REFLEX MICROSCOPIC      Result Value Range   Color, Urine YELLOW  YELLOW   APPearance CLEAR  CLEAR   Specific Gravity, Urine 1.025  1.005 - 1.030   pH 6.0  5.0 - 8.0   Glucose, UA NEGATIVE  NEGATIVE mg/dL   Hgb urine dipstick NEGATIVE  NEGATIVE   Bilirubin Urine NEGATIVE  NEGATIVE   Ketones, ur NEGATIVE  NEGATIVE mg/dL   Protein, ur NEGATIVE  NEGATIVE mg/dL   Urobilinogen, UA 1.0  0.0 - 1.0 mg/dL   Nitrite NEGATIVE  NEGATIVE  Leukocytes, UA NEGATIVE  NEGATIVE  CBC WITH DIFFERENTIAL       Result Value Range   WBC 8.9  4.0 - 10.5 K/uL   RBC 4.57  3.87 - 5.11 MIL/uL   Hemoglobin 13.4  12.0 - 15.0 g/dL   HCT 40.9  81.1 - 91.4 %   MCV 87.5  78.0 - 100.0 fL   MCH 29.3  26.0 - 34.0 pg   MCHC 33.5  30.0 - 36.0 g/dL   RDW 78.2  95.6 - 21.3 %   Platelets 193  150 - 400 K/uL   Neutrophils Relative 53  43 - 77 %   Neutro Abs 4.7  1.7 - 7.7 K/uL   Lymphocytes Relative 33  12 - 46 %   Lymphs Abs 2.9  0.7 - 4.0 K/uL   Monocytes Relative 7  3 - 12 %   Monocytes Absolute 0.7  0.1 - 1.0 K/uL   Eosinophils Relative 6 (*) 0 - 5 %   Eosinophils Absolute 0.6  0.0 - 0.7 K/uL   Basophils Relative 1  0 - 1 %   Basophils Absolute 0.1  0.0 - 0.1 K/uL  COMPREHENSIVE METABOLIC PANEL      Result Value Range   Sodium 138  135 - 145 mEq/L   Potassium 4.0  3.5 - 5.1 mEq/L   Chloride 105  96 - 112 mEq/L   CO2 27  19 - 32 mEq/L   Glucose, Bld 88  70 - 99 mg/dL   BUN 8  6 - 23 mg/dL   Creatinine, Ser 0.86  0.50 - 1.10 mg/dL   Calcium 8.7  8.4 - 57.8 mg/dL   Total Protein 6.5  6.0 - 8.3 g/dL   Albumin 3.4 (*) 3.5 - 5.2 g/dL   AST 14  0 - 37 U/L   ALT 17  0 - 35 U/L   Alkaline Phosphatase 90  39 - 117 U/L   Total Bilirubin 0.1 (*) 0.3 - 1.2 mg/dL   GFR calc non Af Amer >90  >90 mL/min   GFR calc Af Amer >90  >90 mL/min  LIPASE, BLOOD      Result Value Range   Lipase 57  11 - 59 U/L   No results found.  No diagnosis found.  Bladder scan postvoid residual shows 5 mL of urine and bladder.  MDM  Symptoms and exam consistent with biliary colic Since patient has eaten recently an ultrasound at this time would not be useful. Patient resting comfortably nontoxic. I do not feel that clinically she has acute cholecystitis Plan avoid fatty foods Clear liquids for 12 hours Followup with Dr. Nathanial Rancher tomorrow Diagnosis #1abdominal pain #2 dysuria        Doug Sou, MD 03/04/13 2349

## 2013-03-04 NOTE — ED Notes (Signed)
MD at bedside. 

## 2013-03-04 NOTE — ED Notes (Signed)
Pt c/o epigastric pain radiating to right upper quad x few wks; increased pain post meal and at night; nausea/vomiting off and on

## 2013-03-08 ENCOUNTER — Other Ambulatory Visit (HOSPITAL_COMMUNITY): Payer: Self-pay | Admitting: Family Medicine

## 2013-03-08 DIAGNOSIS — R1013 Epigastric pain: Secondary | ICD-10-CM

## 2013-03-08 DIAGNOSIS — R109 Unspecified abdominal pain: Secondary | ICD-10-CM

## 2013-03-12 ENCOUNTER — Ambulatory Visit (HOSPITAL_COMMUNITY)
Admission: RE | Admit: 2013-03-12 | Discharge: 2013-03-12 | Disposition: A | Discharge status: Home / Self Care | Payer: 59 | Source: Ambulatory Visit | Attending: Family Medicine | Admitting: Family Medicine

## 2013-03-12 DIAGNOSIS — R1013 Epigastric pain: Secondary | ICD-10-CM | POA: Insufficient documentation

## 2013-03-12 DIAGNOSIS — K838 Other specified diseases of biliary tract: Secondary | ICD-10-CM | POA: Insufficient documentation

## 2013-03-12 DIAGNOSIS — R109 Unspecified abdominal pain: Secondary | ICD-10-CM

## 2013-03-15 ENCOUNTER — Other Ambulatory Visit (HOSPITAL_COMMUNITY): Payer: Self-pay | Admitting: Family Medicine

## 2013-03-15 DIAGNOSIS — R109 Unspecified abdominal pain: Secondary | ICD-10-CM

## 2013-03-30 ENCOUNTER — Encounter (HOSPITAL_COMMUNITY)
Admission: RE | Admit: 2013-03-30 | Discharge: 2013-03-30 | Disposition: A | Discharge status: Home / Self Care | Payer: 59 | Source: Ambulatory Visit | Attending: Family Medicine | Admitting: Family Medicine

## 2013-03-30 DIAGNOSIS — R109 Unspecified abdominal pain: Secondary | ICD-10-CM | POA: Insufficient documentation

## 2013-03-30 MED ORDER — SINCALIDE 5 MCG IJ SOLR: 0.0200 ug/kg | Freq: Once | INTRAMUSCULAR | Status: AC

## 2013-03-30 MED ORDER — SINCALIDE 5 MCG IJ SOLR
INTRAMUSCULAR | Status: AC
  Administered 2013-03-30: 2.14 ug via INTRAVENOUS
  Filled 2013-03-30: qty 10

## 2013-03-30 MED ORDER — TECHNETIUM TC 99M MEBROFENIN IV KIT
5.0000 | PACK | Freq: Once | INTRAVENOUS | Status: AC | PRN
  Administered 2013-03-30: 5 via INTRAVENOUS

## 2013-09-06 ENCOUNTER — Other Ambulatory Visit: Payer: Self-pay

## 2014-01-05 ENCOUNTER — Emergency Department: Payer: Self-pay | Admitting: Emergency Medicine

## 2014-01-07 ENCOUNTER — Emergency Department: Payer: Self-pay | Admitting: Emergency Medicine

## 2014-02-06 IMAGING — US US ABDOMEN COMPLETE
1 series · 14 of 25 positions shown · non-contrast
Comparison: None.

CLINICAL DATA: Epigastric abdominal pain x2 weeks

COMPLETE ABDOMINAL ULTRASOUND

[Series 1: us abdomen complete · 14 of 66 slices shown]
[im 1/66]
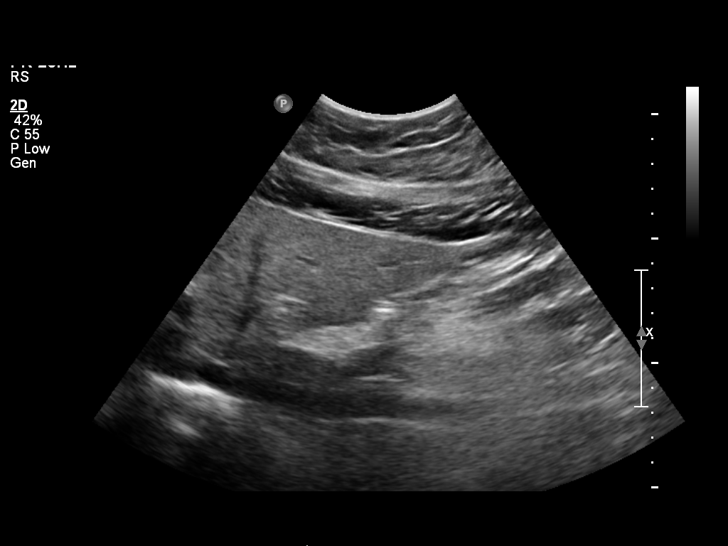
[im 6/66]
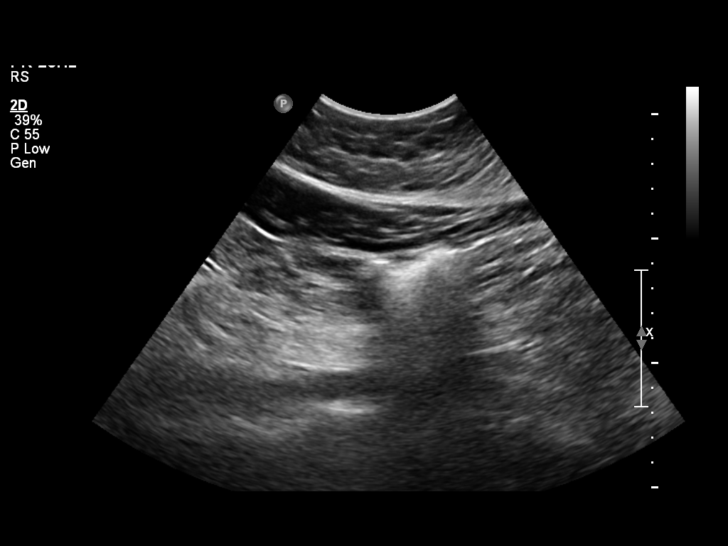
[im 11/66]
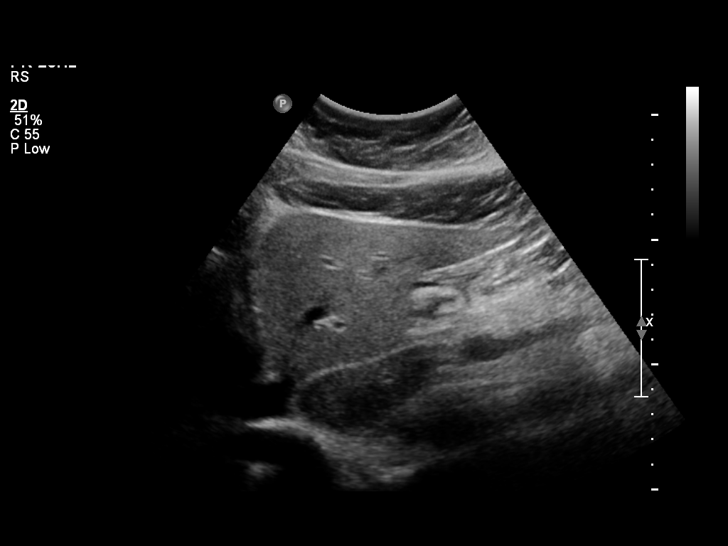
[im 17/66]
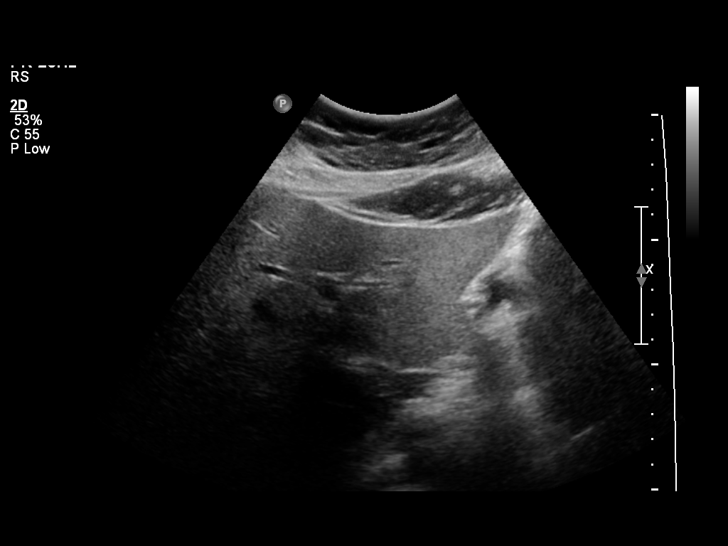
[im 22/66]
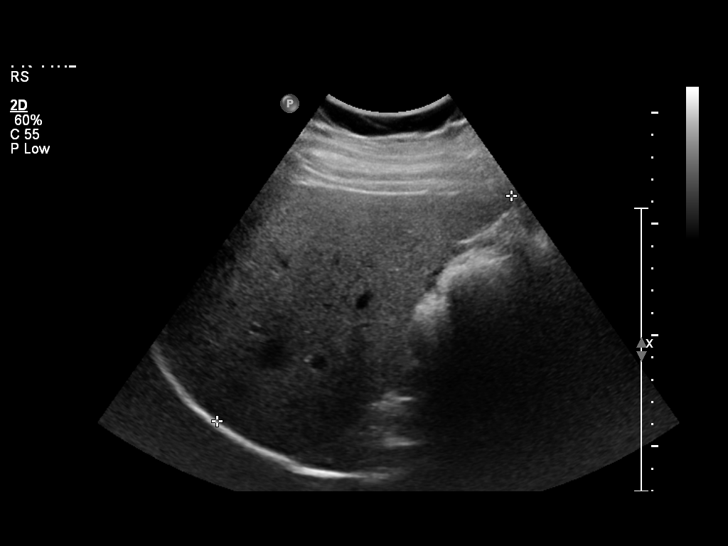
[im 25/66]
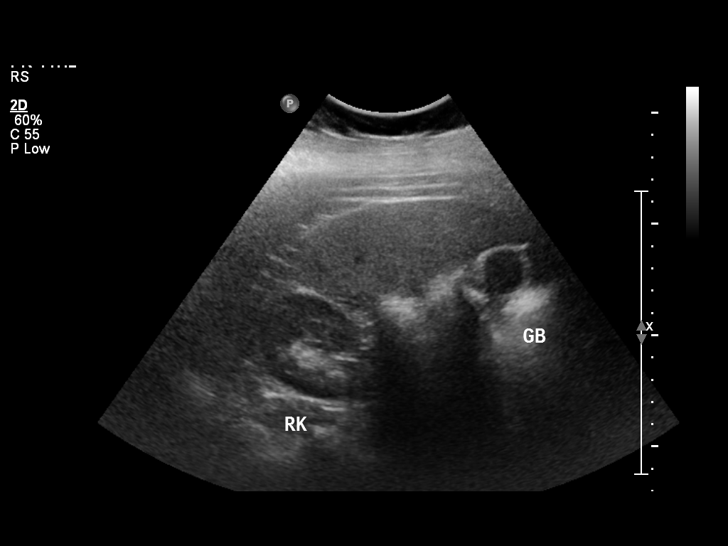
[im 30/66]
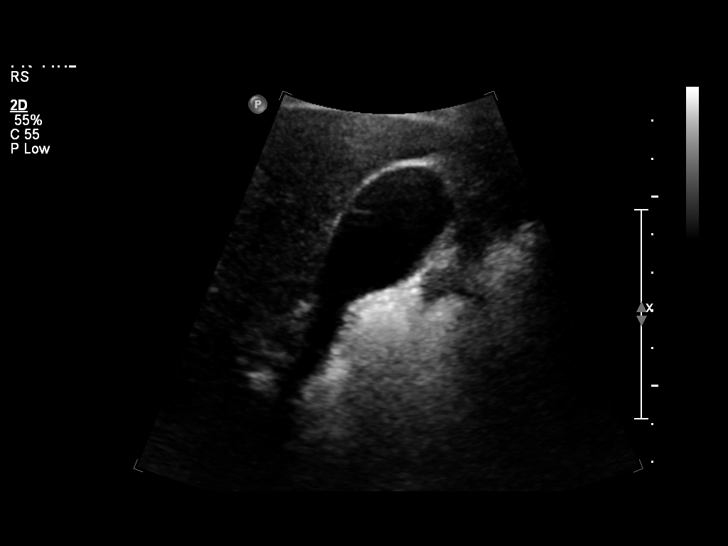
[im 36/66]
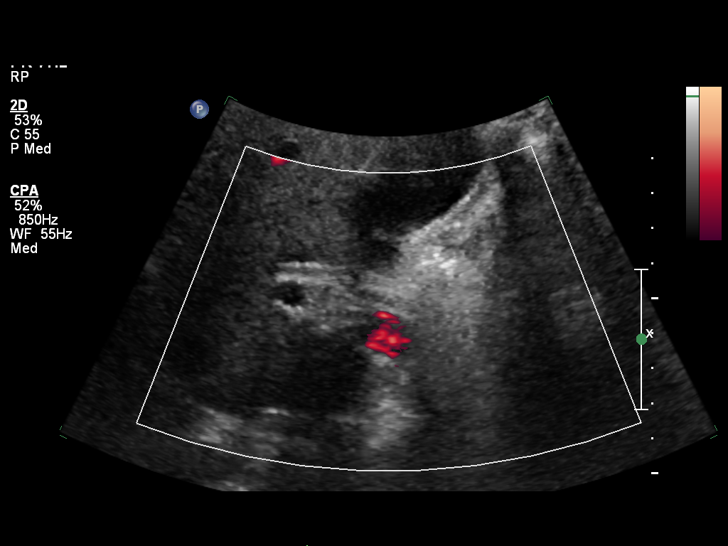
[im 41/66]
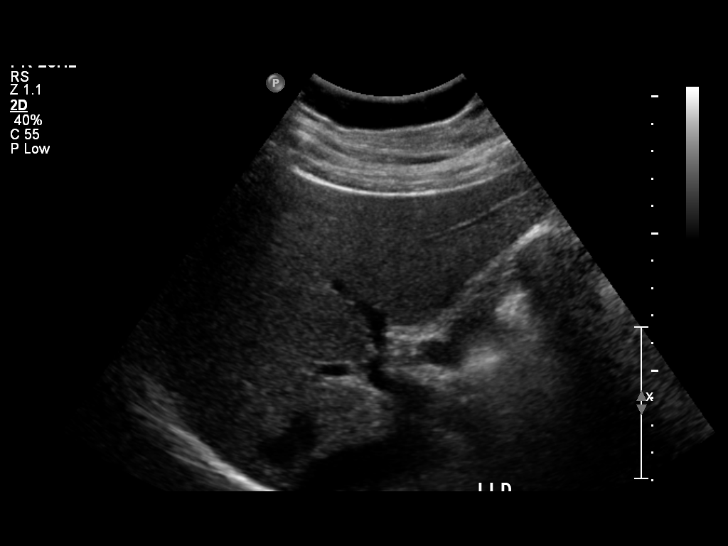
[im 44/66]
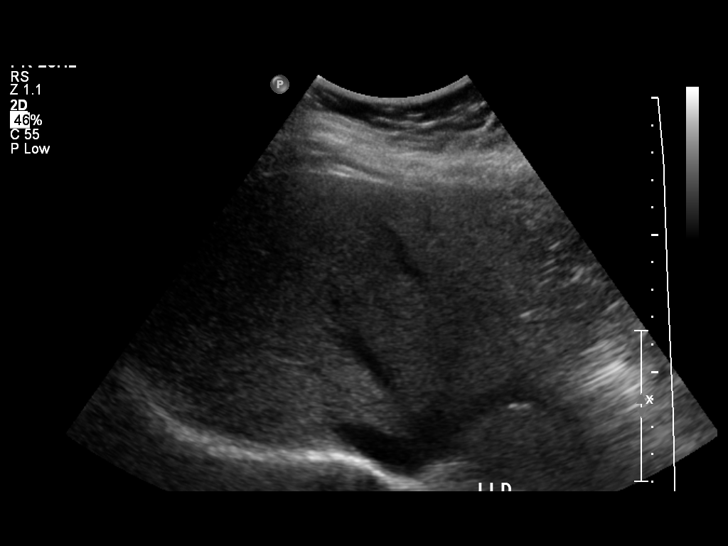
[im 49/66]
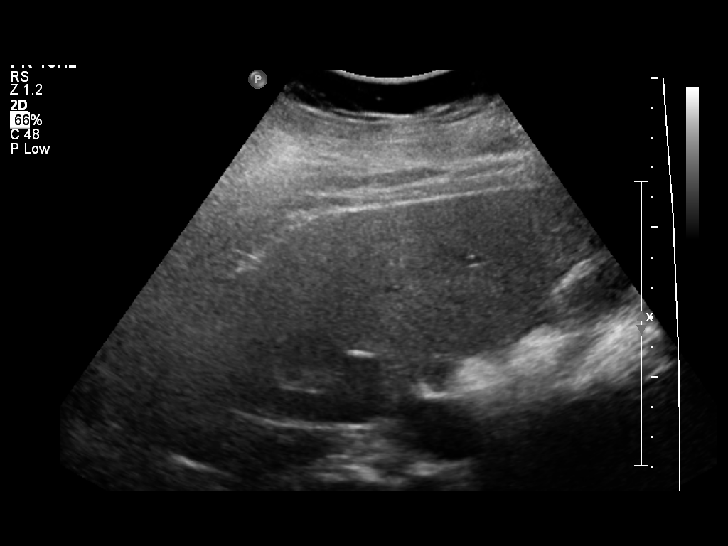
[im 55/66]
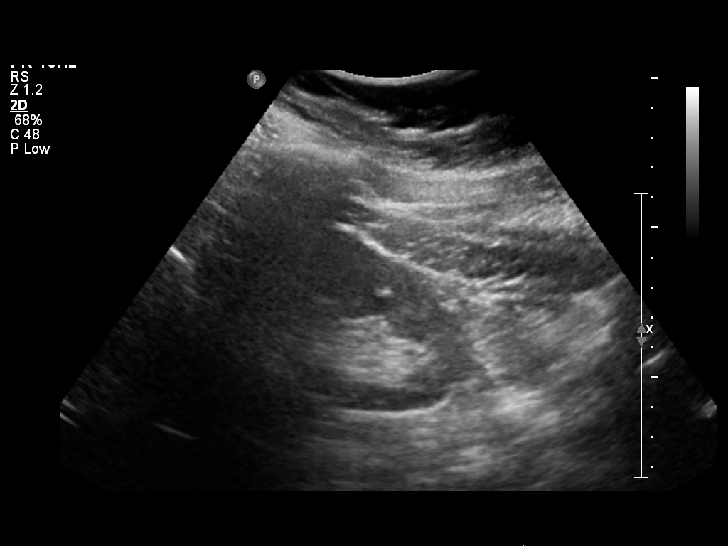
[im 60/66]
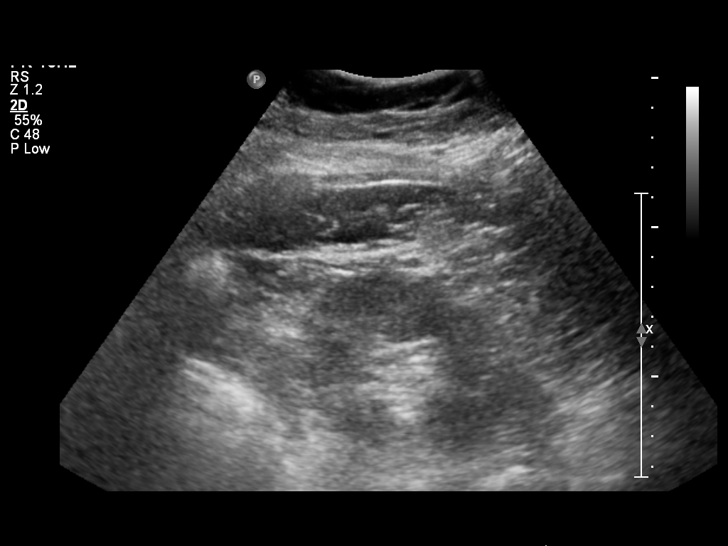
[im 66/66]
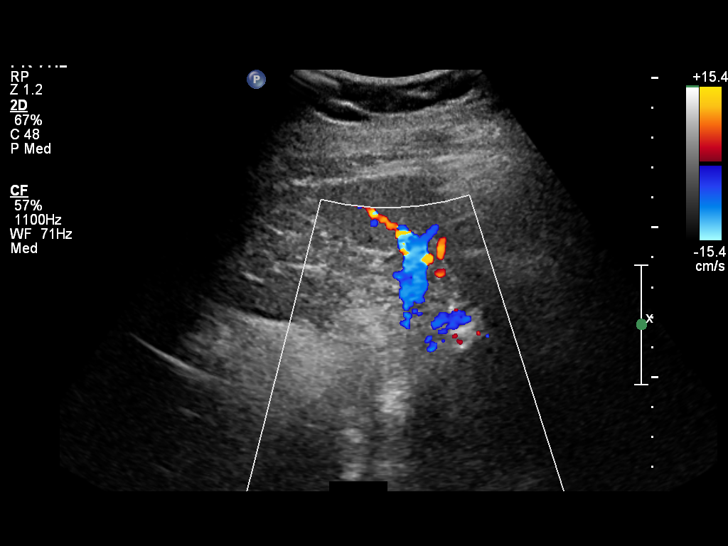

[14 of 25 positions shown; findings below may reference images not displayed]

FINDINGS: Gallbladder:  Suspected mild layering gallbladder sludge.  No
gallstones, gallbladder wall thickening, or pericholecystic fluid.

Common bile duct:  Measures 3 mm.

Liver:  No focal lesion identified.  At the upper limits of normal
for parenchymal echogenicity.

IVC:  Appears normal.

Pancreas:  Incompletely visualized but grossly unremarkable.

Spleen:  Measures 5.2 cm.

Right Kidney:  Measures 10.8 cm.  No mass or hydronephrosis.

Left Kidney:  Measures 10.3 cm.  Poorly visualized.  No
hydronephrosis.

Abdominal aorta:  No aneurysm identified.
IMPRESSION: Possible layering gallbladder sludge.  No associated findings to
suggest acute cholecystitis.

## 2015-06-03 ENCOUNTER — Ambulatory Visit (HOSPITAL_BASED_OUTPATIENT_CLINIC_OR_DEPARTMENT_OTHER): Payer: Self-pay

## 2015-08-21 ENCOUNTER — Ambulatory Visit (HOSPITAL_BASED_OUTPATIENT_CLINIC_OR_DEPARTMENT_OTHER): Payer: Medicaid Other | Attending: Family Medicine

## 2015-08-21 VITALS — Ht 65.0 in | Wt 260.0 lb

## 2015-08-21 DIAGNOSIS — E669 Obesity, unspecified: Secondary | ICD-10-CM | POA: Insufficient documentation

## 2015-08-21 DIAGNOSIS — R5383 Other fatigue: Secondary | ICD-10-CM | POA: Insufficient documentation

## 2015-08-21 DIAGNOSIS — G4733 Obstructive sleep apnea (adult) (pediatric): Secondary | ICD-10-CM

## 2015-08-21 DIAGNOSIS — R0683 Snoring: Secondary | ICD-10-CM

## 2015-08-21 DIAGNOSIS — G4719 Other hypersomnia: Secondary | ICD-10-CM | POA: Diagnosis present

## 2015-08-21 DIAGNOSIS — Z6841 Body Mass Index (BMI) 40.0 and over, adult: Secondary | ICD-10-CM | POA: Insufficient documentation

## 2015-08-24 ENCOUNTER — Encounter (HOSPITAL_BASED_OUTPATIENT_CLINIC_OR_DEPARTMENT_OTHER): Payer: Medicaid Other | Admitting: Internal Medicine

## 2015-08-24 DIAGNOSIS — G4733 Obstructive sleep apnea (adult) (pediatric): Secondary | ICD-10-CM | POA: Diagnosis not present

## 2015-08-24 DIAGNOSIS — R0683 Snoring: Secondary | ICD-10-CM

## 2015-08-24 NOTE — Progress Notes (Signed)
  Patient Name: Belinda [name], Belinda [name] Date: 08/21/2015 Gender: Female D.O.B: January 04, 1981 Age (years): 34 Referring Provider: Cristela Blue Hamrick Height (inches): 65 Interpreting Physician: Baird Lyons MD, ABSM Weight (lbs): 260 RPSGT: Joni Reining BMI: 79 MRN: [id number] Neck Size: 16.50 CLINICAL INFORMATION Sleep Study Type: NPSG Indication for sleep study: Excessive Daytime Sleepiness, Fatigue, Obesity, Snoring, Witnessed Apneas Epworth Sleepiness Score: 9  SLEEP STUDY TECHNIQUE As per the AASM Manual for the Scoring of Sleep and Associated Events v2.3 (April 2016) with a hypopnea requiring 4% desaturations. The channels recorded and monitored were frontal, central and occipital EEG, electrooculogram (EOG), submentalis EMG (chin), nasal and oral airflow, thoracic and abdominal wall motion, anterior tibialis EMG, snore microphone, electrocardiogram, and pulse oximetry.  MEDICATIONS Patient's medications include: charted for review. Medications self-administered by patient during sleep study : prozac, flexeril  SLEEP ARCHITECTURE The study was initiated at 10:08:18 PM and ended at 4:18:47 AM. Sleep onset time was 24.3 minutes and the sleep efficiency was 79.2%. The total sleep time was 293.5 minutes. Stage REM latency was 186.0 minutes. The patient spent 3.24% of the night in stage N1 sleep, 63.71% in stage N2 sleep, 22.32% in stage N3 and 10.73% in REM. Alpha intrusion was absent. Supine sleep was 43.44%.  RESPIRATORY PARAMETERS The overall apnea/hypopnea index (AHI) was 2.7 per hour. There were 2 total apneas, including 2 obstructive, 0 central and 0 mixed apneas. There were 11 hypopneas and 70 RERAs. The AHI during Stage REM sleep was 1.9 per hour. AHI while supine was 4.2 per hour. The mean oxygen saturation was 96.47%. The minimum SpO2 during sleep was 89.00%. Moderate snoring was noted during this study. Wake after sleep onset 52.7 minutes  CARDIAC DATA The 2 lead EKG  demonstrated sinus rhythm. The mean heart rate was 84.01 beats per minute. Other EKG findings include: None.  LEG MOVEMENT DATA The total PLMS were 0 with a resulting PLMS index of 0.00. Associated arousal with leg movement index was 0.0 .  IMPRESSIONS - No significant obstructive sleep apnea occurred during this study (AHI = 2.7/h). - No significant central sleep apnea occurred during this study (CAI = 0.0/h). - The patient had minimal or no oxygen desaturation during the study (Min O2 = 89.00%) - The patient snored with Moderate snoring volume. - No cardiac abnormalities were noted during this study. - Clinically significant periodic limb movements did not occur during sleep. No significant associated arousals.  DIAGNOSIS - Primary Snoring (786.09 [R06.83 ICD-10]) - Normal study  RECOMMENDATIONS - Avoid alcohol, sedatives and other CNS depressants that may worsen sleep apnea and disrupt normal sleep architecture. - Sleep hygiene should be reviewed to assess factors that may improve sleep quality. - Weight management and regular exercise should be initiated or continued if appropriate.  Deneise Lever Diplomate, American Board of Sleep Medicine  ELECTRONICALLY SIGNED ON:  08/24/2015, 1:20 PM [place] PH: [phone]   FX: [phone] [place]

## 2015-11-26 ENCOUNTER — Emergency Department: Payer: Medicaid Other

## 2015-11-26 ENCOUNTER — Emergency Department
Admission: EM | Admit: 2015-11-26 | Discharge: 2015-11-26 | Disposition: A | Discharge status: Home / Self Care | Payer: Medicaid Other | Attending: Emergency Medicine | Admitting: Emergency Medicine

## 2015-11-26 DIAGNOSIS — Y9289 Other specified places as the place of occurrence of the external cause: Secondary | ICD-10-CM | POA: Diagnosis not present

## 2015-11-26 DIAGNOSIS — Z87891 Personal history of nicotine dependence: Secondary | ICD-10-CM | POA: Insufficient documentation

## 2015-11-26 DIAGNOSIS — Y998 Other external cause status: Secondary | ICD-10-CM | POA: Diagnosis not present

## 2015-11-26 DIAGNOSIS — Y9389 Activity, other specified: Secondary | ICD-10-CM | POA: Insufficient documentation

## 2015-11-26 DIAGNOSIS — S9031XA Contusion of right foot, initial encounter: Secondary | ICD-10-CM | POA: Diagnosis not present

## 2015-11-26 DIAGNOSIS — S99921A Unspecified injury of right foot, initial encounter: Secondary | ICD-10-CM | POA: Diagnosis present

## 2015-11-26 DIAGNOSIS — R52 Pain, unspecified: Secondary | ICD-10-CM

## 2015-11-26 DIAGNOSIS — Z79899 Other long term (current) drug therapy: Secondary | ICD-10-CM | POA: Insufficient documentation

## 2015-11-26 DIAGNOSIS — W208XXA Other cause of strike by thrown, projected or falling object, initial encounter: Secondary | ICD-10-CM | POA: Insufficient documentation

## 2015-11-26 MED ORDER — IBUPROFEN 800 MG PO TABS: 800.0000 mg | ORAL_TABLET | Freq: Three times a day (TID) | ORAL | Status: DC | PRN

## 2015-11-26 NOTE — ED Provider Notes (Signed)
[facility] Emergency Department Provider Note  ____________________________________________  Time seen: Approximately 8:37 PM  I have reviewed the triage vital signs and the nursing notes.   HISTORY  Chief Complaint Foot Pain    HPI Belinda [name] is a 35 y.o. female is here for evaluation of right foot pain.She reports having a bedframe dropped on the dorsal aspect of her right foot.   History reviewed. No pertinent past medical history.  There are no active problems to display for this patient.   Past Surgical History  Procedure Laterality Date  . Abdominal hysterectomy      Current Outpatient Rx  Name  Route  Sig  Dispense  Refill  . FLUoxetine (PROZAC) 20 MG capsule   Oral   Take 20 mg by mouth daily.         Marland Kitchen ibuprofen (ADVIL,MOTRIN) 800 MG tablet   Oral   Take 1 tablet (800 mg total) by mouth every 8 (eight) hours as needed.   30 tablet   0     Allergies Review of patient's allergies indicates no known allergies.  No family history on file.  Social History Social History  Substance Use Topics  . Smoking status: Former Smoker -- 0.50 packs/day    Types: Cigarettes  . Smokeless tobacco: Never Used  . Alcohol Use: No    Review of Systems Constitutional: No fever/chills Eyes: No visual changes. ENT: No sore throat. Cardiovascular: Denies chest pain. Respiratory: Denies shortness of breath. Gastrointestinal: No abdominal pain.  No nausea, no vomiting.  No diarrhea.  No constipation. Genitourinary: Negative for dysuria. Musculoskeletal: Positive for right foot pain. Skin: Negative for rash. Neurological: Negative for headaches, focal weakness or numbness.  10-point ROS otherwise negative.  ____________________________________________   PHYSICAL EXAM:  VITAL SIGNS: ED Triage Vitals  Enc Vitals Group     BP --      Pulse --      Resp --      Temp --      Temp src --      SpO2 --      Weight --      Height --       Head Cir --      Peak Flow --      Pain Score --      Pain Loc --      Pain Edu? --      Excl. in [place]? --     Constitutional: Alert and oriented. Well appearing and in no acute distress. Cardiovascular: Normal rate, regular rhythm. Grossly normal heart sounds.  Good peripheral circulation. Respiratory: Normal respiratory effort.  No retractions. Lungs CTAB. Musculoskeletal: Dorsal aspect of right foot with positive tenderness. Minimal ecchymosis noted for range of motion. Formerly. Distally neurovascularly intact. Neurologic:  Normal speech and language. No gross focal neurologic deficits are appreciated. No gait instability. Skin:  Skin is warm, dry and intact. No rash noted. Psychiatric: Mood and affect are normal. Speech and behavior are normal.  ____________________________________________   LABS (all labs ordered are listed, but only abnormal results are displayed)  Labs Reviewed - No data to display   RADIOLOGY  Positive dorsal swelling. Negative for any acute fracture. ____________________________________________   PROCEDURES  Procedure(s) performed: None  Critical Care performed: No  ____________________________________________   INITIAL IMPRESSION / ASSESSMENT AND PLAN / ED COURSE  Pertinent labs & imaging results that were available during my care of the patient were reviewed by me and considered in  my medical decision making (see chart for details).  Status post contusion to the right foot. Rx given for Motrin 800 mg 3 times a day. Ace wrap provided as needed for comfort. Ice therapy and elevation. Patient voices no other emergency medical complaints at this time. ____________________________________________   FINAL CLINICAL IMPRESSION(S) / ED DIAGNOSES  Final diagnoses:  Foot contusion, right, initial encounter      Arlyss Repress, PA-C 11/26/15 2153  Lisa Roca, MD 11/26/15 2215

## 2015-11-26 NOTE — ED Notes (Signed)
Patient was helping to move a bed and it fell onto her right foot. Presents with pain across top of foot. No swelling or ecchymosis noted.

## 2015-11-26 NOTE — Discharge Instructions (Signed)
Foot Contusion A foot contusion is a deep bruise to the foot. Contusions are the result of an injury that caused bleeding under the skin. The contusion may turn blue, purple, or yellow. Minor injuries will give you a painless contusion, but more severe contusions may stay painful and swollen for a few weeks. CAUSES  A foot contusion comes from a direct blow to that area, such as a heavy object falling on the foot. SYMPTOMS   Swelling of the foot.  Discoloration of the foot.  Tenderness or soreness of the foot. DIAGNOSIS  You will have a physical exam and will be asked about your history. You may need an X-ray of your foot to look for a broken bone (fracture).  TREATMENT  An elastic wrap may be recommended to support your foot. Resting, elevating, and applying cold compresses to your foot are often the best treatments for a foot contusion. Over-the-counter medicines may also be recommended for pain control. HOME CARE INSTRUCTIONS   Put ice on the injured area.  Put ice in a plastic bag.  Place a towel between your skin and the bag.  Leave the ice on for 15-20 minutes, 03-04 times a day.  Only take over-the-counter or prescription medicines for pain, discomfort, or fever as directed by your caregiver.  If told, use an elastic wrap as directed. This can help reduce swelling. You may remove the wrap for sleeping, showering, and bathing. If your toes become numb, cold, or blue, take the wrap off and reapply it more loosely.  Elevate your foot with pillows to reduce swelling.  Try to avoid standing or walking while the foot is painful. Do not resume use until instructed by your caregiver. Then, begin use gradually. If pain develops, decrease use. Gradually increase activities that do not cause discomfort until you have normal use of your foot.  See your caregiver as directed. It is very important to keep all follow-up appointments in order to avoid any lasting problems with your foot,  including long-term (chronic) pain. SEEK IMMEDIATE MEDICAL CARE IF:   You have increased redness, swelling, or pain in your foot.  Your swelling or pain is not relieved with medicines.  You have loss of feeling in your foot or are unable to move your toes.  Your foot turns cold or blue.  You have pain when you move your toes.  Your foot becomes warm to the touch.  Your contusion does not improve in 2 days. MAKE SURE YOU:   Understand these instructions.  Will watch your condition.  Will get help right away if you are not doing well or get worse.   This information is not intended to replace advice given to you by your health care provider. Make sure you discuss any questions you have with your health care provider.   Document Released: 08/09/2006 Document Revised: 04/18/2012 Document Reviewed: 06/24/2015 Elsevier Interactive Patient Education 2016 [place] therapy can help ease sore, stiff, injured, and tight muscles and joints. Heat relaxes your muscles, which may help ease your pain.  RISKS AND COMPLICATIONS If you have any of the following conditions, do not use heat therapy unless your health care provider has approved:  Poor circulation.  Healing wounds or scarred skin in the area being treated.  Diabetes, heart disease, or high blood pressure.  Not being able to feel (numbness) the area being treated.  Unusual swelling of the area being treated.  Active infections.  Blood clots.  Cancer.  Inability to communicate pain. This may include young children and people who have problems with their brain function (dementia).  Pregnancy. Heat therapy should only be used on old, pre-existing, or long-lasting (chronic) injuries. Do not use heat therapy on new injuries unless directed by your health care provider. HOW TO USE HEAT THERAPY There are several different kinds of heat therapy, including:  Moist heat pack.  Warm water bath.  Hot  water bottle.  Electric heating pad.  Heated gel pack.  Heated wrap.  Electric heating pad. Use the heat therapy method suggested by your health care provider. Follow your health care provider's instructions on when and how to use heat therapy. GENERAL HEAT THERAPY RECOMMENDATIONS  Do not sleep while using heat therapy. Only use heat therapy while you are awake.  Your skin may turn pink while using heat therapy. Do not use heat therapy if your skin turns red.  Do not use heat therapy if you have new pain.  High heat or long exposure to heat can cause burns. Be careful when using heat therapy to avoid burning your skin.  Do not use heat therapy on areas of your skin that are already irritated, such as with a rash or sunburn. SEEK MEDICAL CARE IF:  You have blisters, redness, swelling, or numbness.  You have new pain.  Your pain is worse. MAKE SURE YOU:  Understand these instructions.  Will watch your condition.  Will get help right away if you are not doing well or get worse.   This information is not intended to replace advice given to you by your health care provider. Make sure you discuss any questions you have with your health care provider.   Document Released: 01/10/2012 Document Revised: 11/08/2014 Document Reviewed: 12/11/2013 Elsevier Interactive Patient Education 2016 [place].  Cryotherapy Cryotherapy means treatment with cold. Ice or gel packs can be used to reduce both pain and swelling. Ice is the most helpful within the first 24 to 48 hours after an injury or flare-up from overusing a muscle or joint. Sprains, strains, spasms, burning pain, shooting pain, and aches can all be eased with ice. Ice can also be used when recovering from surgery. Ice is effective, has very few side effects, and is safe for most people to use. PRECAUTIONS  Ice is not a safe treatment option for people with:  Raynaud phenomenon. This is a condition affecting small blood  vessels in the extremities. Exposure to cold may cause your problems to return.  Cold hypersensitivity. There are many forms of cold hypersensitivity, including:  Cold urticaria. Red, itchy hives appear on the skin when the tissues begin to warm after being iced.  Cold erythema. This is a red, itchy rash caused by exposure to cold.  Cold hemoglobinuria. Red blood cells break down when the tissues begin to warm after being iced. The hemoglobin that carry oxygen are passed into the urine because they cannot combine with blood proteins fast enough.  Numbness or altered sensitivity in the area being iced. If you have any of the following conditions, do not use ice until you have discussed cryotherapy with your caregiver:  Heart conditions, such as arrhythmia, angina, or chronic heart disease.  High blood pressure.  Healing wounds or open skin in the area being iced.  Current infections.  Rheumatoid arthritis.  Poor circulation.  Diabetes. Ice slows the blood flow in the region it is applied. This is beneficial when trying to stop inflamed tissues from spreading irritating chemicals to  surrounding tissues. However, if you expose your skin to cold temperatures for too long or without the proper protection, you can damage your skin or nerves. Watch for signs of skin damage due to cold. HOME CARE INSTRUCTIONS Follow these tips to use ice and cold packs safely.  Place a dry or damp towel between the ice and skin. A damp towel will cool the skin more quickly, so you may need to shorten the time that the ice is used.  For a more rapid response, add gentle compression to the ice.  Ice for no more than 10 to 20 minutes at a time. The bonier the area you are icing, the less time it will take to get the benefits of ice.  Check your skin after 5 minutes to make sure there are no signs of a poor response to cold or skin damage.  Rest 20 minutes or more between uses.  Once your skin is numb,  you can end your treatment. You can test numbness by very lightly touching your skin. The touch should be so light that you do not see the skin dimple from the pressure of your fingertip. When using ice, most people will feel these normal sensations in this order: cold, burning, aching, and numbness.  Do not use ice on someone who cannot communicate their responses to pain, such as small children or people with dementia. HOW TO MAKE AN ICE PACK Ice packs are the most common way to use ice therapy. Other methods include ice massage, ice baths, and cryosprays. Muscle creams that cause a cold, tingly feeling do not offer the same benefits that ice offers and should not be used as a substitute unless recommended by your caregiver. To make an ice pack, do one of the following:  Place crushed ice or a bag of frozen vegetables in a sealable plastic bag. Squeeze out the excess air. Place this bag inside another plastic bag. Slide the bag into a pillowcase or place a damp towel between your skin and the bag.  Mix 3 parts water with 1 part rubbing alcohol. Freeze the mixture in a sealable plastic bag. When you remove the mixture from the freezer, it will be slushy. Squeeze out the excess air. Place this bag inside another plastic bag. Slide the bag into a pillowcase or place a damp towel between your skin and the bag. SEEK MEDICAL CARE IF:  You develop white spots on your skin. This may give the skin a blotchy (mottled) appearance.  Your skin turns blue or pale.  Your skin becomes waxy or hard.  Your swelling gets worse. MAKE SURE YOU:   Understand these instructions.  Will watch your condition.  Will get help right away if you are not doing well or get worse.   This information is not intended to replace advice given to you by your health care provider. Make sure you discuss any questions you have with your health care provider.   Document Released: 06/14/2011 Document Revised: 11/08/2014  Document Reviewed: 06/14/2011 Elsevier Interactive Patient Education [employer].

## 2016-07-11 ENCOUNTER — Encounter (HOSPITAL_COMMUNITY): Payer: Self-pay | Admitting: Emergency Medicine

## 2016-07-11 ENCOUNTER — Emergency Department (HOSPITAL_COMMUNITY)
Admission: EM | Admit: 2016-07-11 | Discharge: 2016-07-11 | Disposition: A | Discharge status: Home / Self Care | Payer: Medicaid Other | Attending: Emergency Medicine | Admitting: Emergency Medicine

## 2016-07-11 DIAGNOSIS — N3289 Other specified disorders of bladder: Secondary | ICD-10-CM | POA: Insufficient documentation

## 2016-07-11 DIAGNOSIS — R339 Retention of urine, unspecified: Secondary | ICD-10-CM | POA: Diagnosis present

## 2016-07-11 DIAGNOSIS — Z87891 Personal history of nicotine dependence: Secondary | ICD-10-CM | POA: Insufficient documentation

## 2016-07-11 DIAGNOSIS — R301 Vesical tenesmus: Secondary | ICD-10-CM

## 2016-07-11 LAB — URINALYSIS, ROUTINE W REFLEX MICROSCOPIC
BILIRUBIN URINE: NEGATIVE
Glucose, UA: NEGATIVE mg/dL
Hgb urine dipstick: NEGATIVE
KETONES UR: NEGATIVE mg/dL
LEUKOCYTES UA: NEGATIVE
NITRITE: NEGATIVE
PROTEIN: NEGATIVE mg/dL
Specific Gravity, Urine: 1.018 (ref 1.005–1.030)
pH: 7 (ref 5.0–8.0)

## 2016-07-11 LAB — I-STAT CHEM 8, ED
BUN: 10 mg/dL (ref 6–20)
CALCIUM ION: 1.06 mmol/L — AB (ref 1.15–1.40)
Chloride: 100 mmol/L — ABNORMAL LOW (ref 101–111)
Creatinine, Ser: 0.8 mg/dL (ref 0.44–1.00)
GLUCOSE: 99 mg/dL (ref 65–99)
HCT: 41 % (ref 36.0–46.0)
Hemoglobin: 13.9 g/dL (ref 12.0–15.0)
Potassium: 4.1 mmol/L (ref 3.5–5.1)
SODIUM: 137 mmol/L (ref 135–145)
TCO2: 26 mmol/L (ref 0–100)

## 2016-07-11 LAB — POC URINE PREG, ED
PREG TEST UR: NEGATIVE
Preg Test, Ur: NEGATIVE

## 2016-07-11 MED ORDER — CEPHALEXIN 250 MG PO CAPS: 250.0000 mg | ORAL_CAPSULE | Freq: Four times a day (QID) | ORAL | 0 refills | Status: DC

## 2016-07-11 MED ORDER — OXYBUTYNIN CHLORIDE ER 5 MG PO TB24: 5.0000 mg | ORAL_TABLET | Freq: Three times a day (TID) | ORAL | 0 refills | Status: DC

## 2016-07-11 MED ORDER — FLUCONAZOLE 100 MG PO TABS
150.0000 mg | ORAL_TABLET | Freq: Once | ORAL | Status: AC
Start: 2016-07-11 — End: 2016-07-11
  Administered 2016-07-11: 150 mg via ORAL
  Filled 2016-07-11: qty 2

## 2016-07-11 NOTE — Discharge Instructions (Signed)
Please take medicines as instructed.  Reccheck with your doctor this week.

## 2016-07-11 NOTE — ED Notes (Signed)
Bladder scanned patient...scan showed approx. 288ml of urine in bladder.

## 2016-07-11 NOTE — ED Notes (Signed)
Pt voided and stated she still felt like she needed to go more but was unable to. Pt reports pressure and fullness over bladder. Tender to palpation.

## 2016-07-11 NOTE — ED Provider Notes (Signed)
[facility] DEPT Provider Note   CSN: [id number] Arrival date & time: 07/11/16  1449     History   Chief Complaint Chief Complaint  Patient presents with  . Urinary Retention    HPI Belinda [name] is a 35 y.o. female. Presents today stating that she has had some urinary incontinence and frequency of urination and sensation that her bladder remains full. Symptoms have been present for several days. She has also noted a foul odor to her urine. She has had a hysterectomy and has had only one sexual partner for several years. She denies any abnormal vaginal discharge or bleeding. She has had urinary tract infections in the past but feels that this is somewhat different. She denies fever, chills, nausea, vomiting, or back pain HPI  History reviewed. No pertinent past medical history.  There are no active problems to display for this patient.   Past Surgical History:  Procedure Laterality Date  . ABDOMINAL HYSTERECTOMY      OB History    No data available       Home Medications    Prior to Admission medications   Medication Sig Start Date End Date Taking? Authorizing Provider  FLUoxetine (PROZAC) 20 MG capsule Take 20 mg by mouth daily.    Historical Provider, MD  ibuprofen (ADVIL,MOTRIN) 800 MG tablet Take 1 tablet (800 mg total) by mouth every 8 (eight) hours as needed. 11/26/15   Arlyss Repress, PA-C    Family History History reviewed. No pertinent family history.  Social History Social History  Substance Use Topics  . Smoking status: Former Smoker    Packs/day: 0.50    Types: Cigarettes  . Smokeless tobacco: Never Used  . Alcohol use No     Allergies   Review of patient's allergies indicates no known allergies.   Review of Systems Review of Systems  All other systems reviewed and are negative.    Physical Exam Updated Vital Signs BP 145/72   Pulse 82   Temp 98.5 F (36.9 C) (Oral)   Resp 15   Ht 5\' 5"  (1.651 m)   Wt 120.2 kg   SpO2 99%   BMI  44.10 kg/m   Physical Exam  Constitutional: She is oriented to person, place, and time. She appears well-developed and well-nourished. No distress.  HENT:  Head: Normocephalic and atraumatic.  Right Ear: External ear normal.  Left Ear: External ear normal.  Nose: Nose normal.  Eyes: Conjunctivae and EOM are normal. Pupils are equal, round, and reactive to light.  Neck: Normal range of motion. Neck supple.  Cardiovascular: Normal rate.   Pulmonary/Chest: Effort normal.  Abdominal: Soft. Bowel sounds are normal.  Suprapubic tenderness  Genitourinary:  Genitourinary Comments: External vaginal exam done with some erythema and whitish discharge consistent with yeast  Musculoskeletal: Normal range of motion.  Neurological: She is alert and oriented to person, place, and time. She exhibits normal muscle tone. Coordination normal.  Skin: Skin is warm and dry.  Psychiatric: She has a normal mood and affect. Her behavior is normal. Thought content normal.  Nursing note and vitals reviewed.    ED Treatments / Results  Labs (all labs ordered are listed, but only abnormal results are displayed) Labs Reviewed  I-STAT CHEM 8, ED - Abnormal; Notable for the following:       Result Value   Chloride 100 (*)    Calcium, Ion 1.06 (*)    All other components within normal limits  URINALYSIS, ROUTINE W REFLEX  MICROSCOPIC (NOT AT [facility])  POC URINE PREG, ED  POC URINE PREG, ED    EKG  EKG Interpretation  Date/Time:  Sunday July 11 2016 14:59:21 EDT Ventricular Rate:  92 PR Interval:  128 QRS Duration: 84 QT Interval:  370 QTC Calculation: 457 R Axis:   62 Text Interpretation:  Normal sinus rhythm Normal ECG Confirmed by Tajai Suder MD, Andee Poles [id number]) on 07/11/2016 5:17:13 PM       Radiology No results found.  Procedures Procedures (including critical care time) Bladder scan done by RN with 99 mL urine noted Medications Ordered in ED Medications - No data to display   Initial  Impression / Assessment and Plan / ED Course  I have reviewed the triage vital signs and the nursing notes.  Pertinent labs & imaging results that were available during my care of the patient were reviewed by me and considered in my medical decision making (see chart for details).  Clinical Course   Patient stable here. Labs checked to check glucose and normal. Urinalysis without evidence of infection. However, given that patient is emptying bladder frequently and appears to have spasm she could have urinary tract infection with leukocytes. She is tender over her bladder. Plan Keflex, and antispasmodics, and for yeast.   Final Clinical Impressions(s) / ED Diagnoses   Final diagnoses:  Painful bladder spasm    New Prescriptions New Prescriptions   No medications on file     Pattricia Boss, MD 07/11/16 1954

## 2016-07-11 NOTE — ED Triage Notes (Addendum)
Pt here for issues emptying her bladder; pt sts pressure and feeling like she needs to void; pt sts foul odor as well

## 2016-07-11 NOTE — ED Notes (Signed)
Post void residual 59ml per bladder scanner

## 2016-07-11 NOTE — ED Notes (Signed)
Bladder scanned pt, 99 ml of urine left after she voided, stated she feels like she needs to go more, but can't.

## 2016-10-22 IMAGING — CR DG FOOT COMPLETE 3+V*R*
1 series · 3 of 3 positions shown · non-contrast
Comparison: None.

CLINICAL DATA: Bed fell onto patient's right foot, with dorsal
right foot pain. Initial encounter.

EXAM:
RIGHT FOOT COMPLETE - 3+ VIEW

[Series 1: dg foot complete right · 0.14mm/px · 3 of 3 slices shown]
[im 1/3]
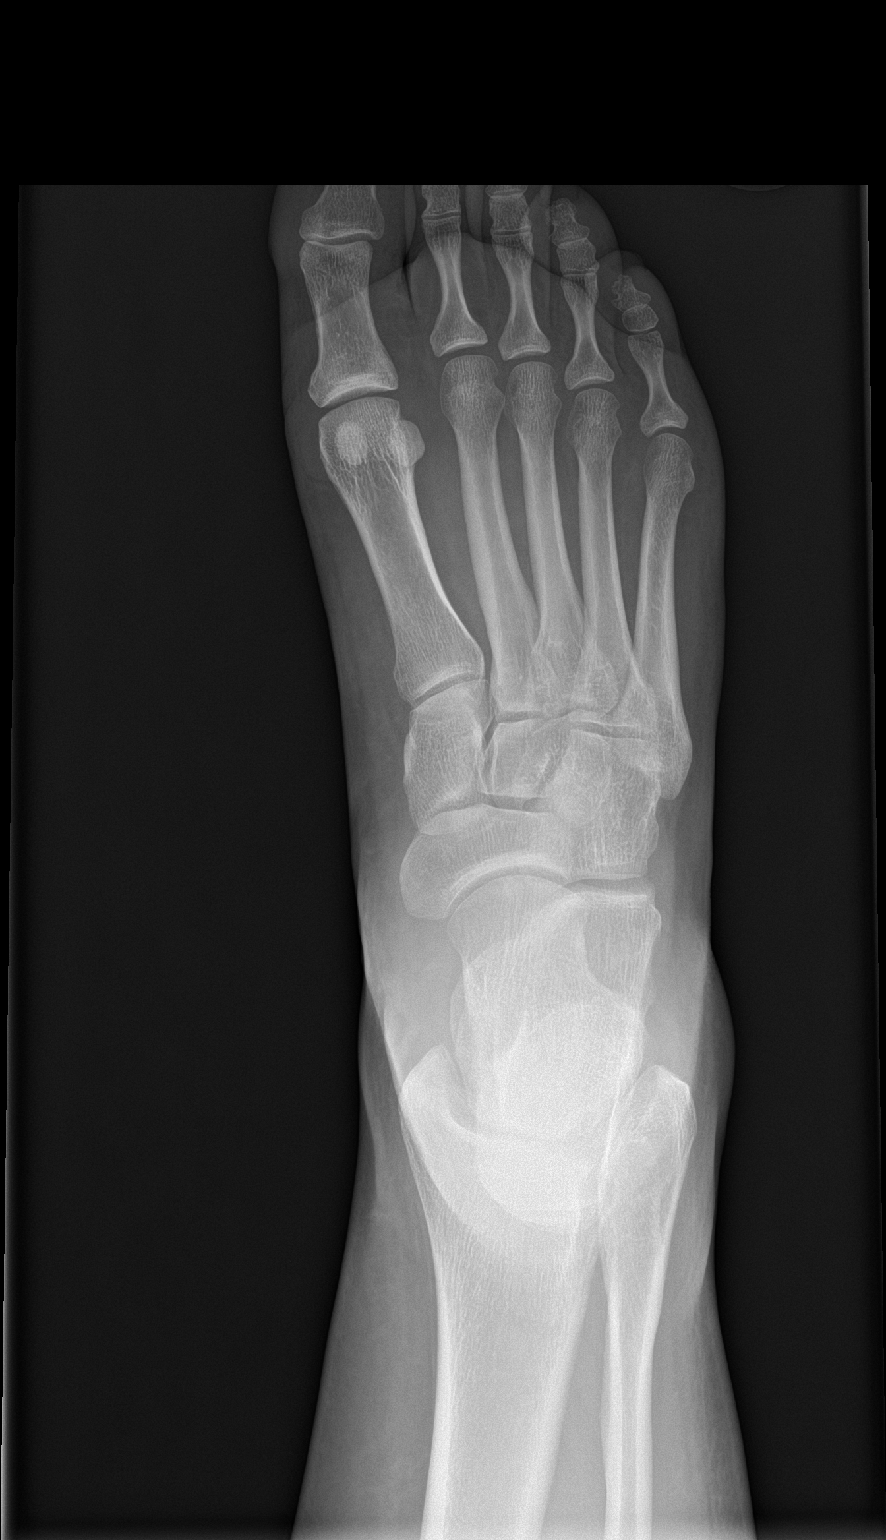
[im 2/3]
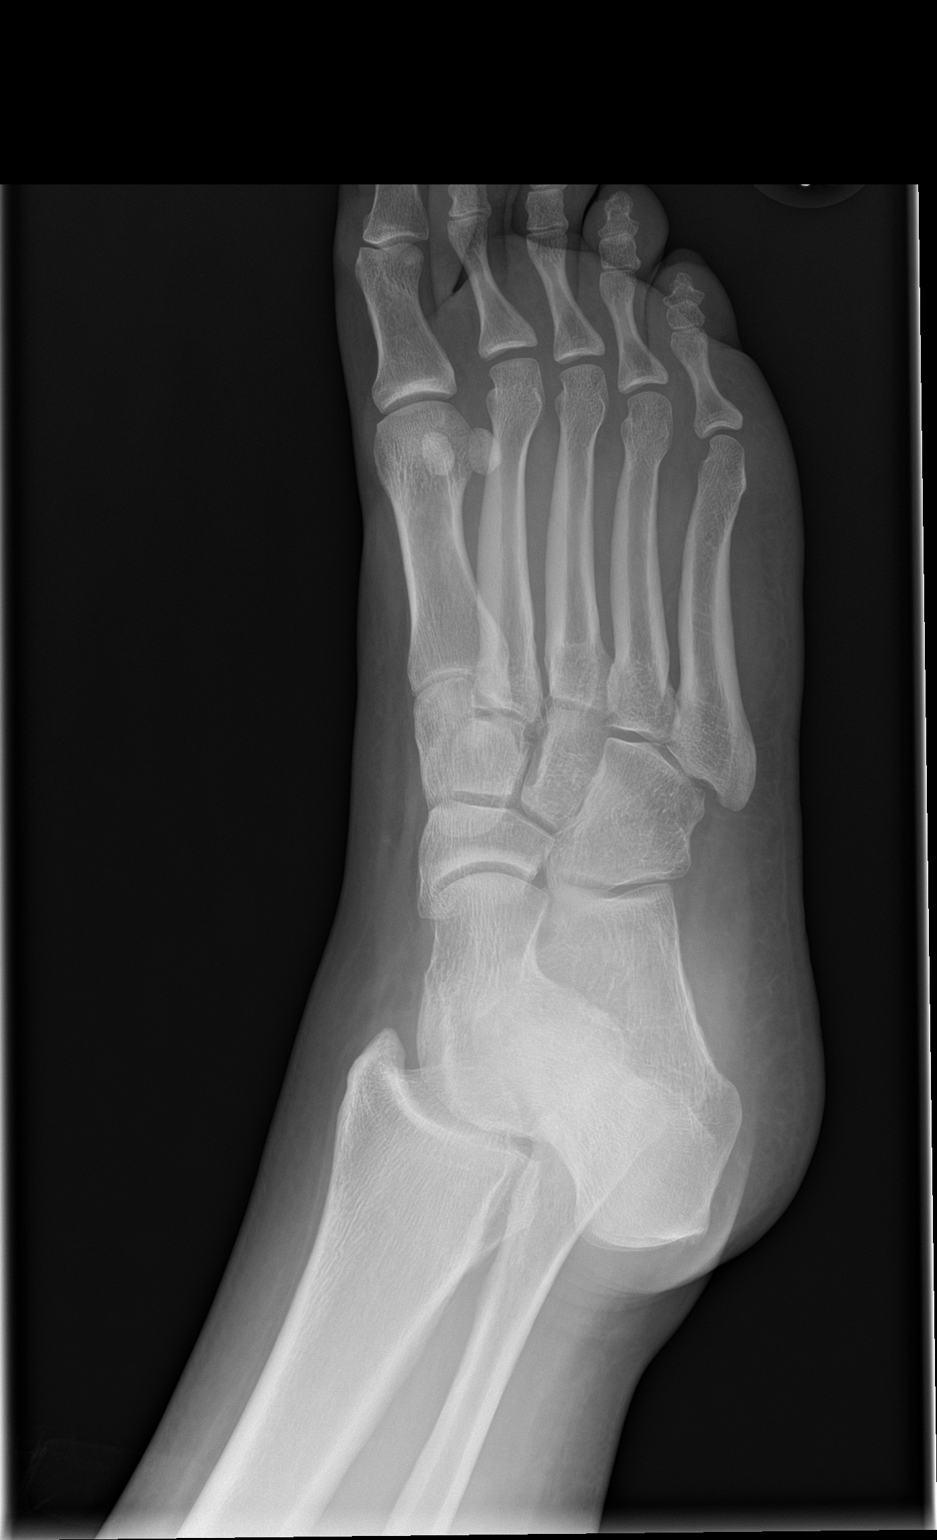
[im 3/3]
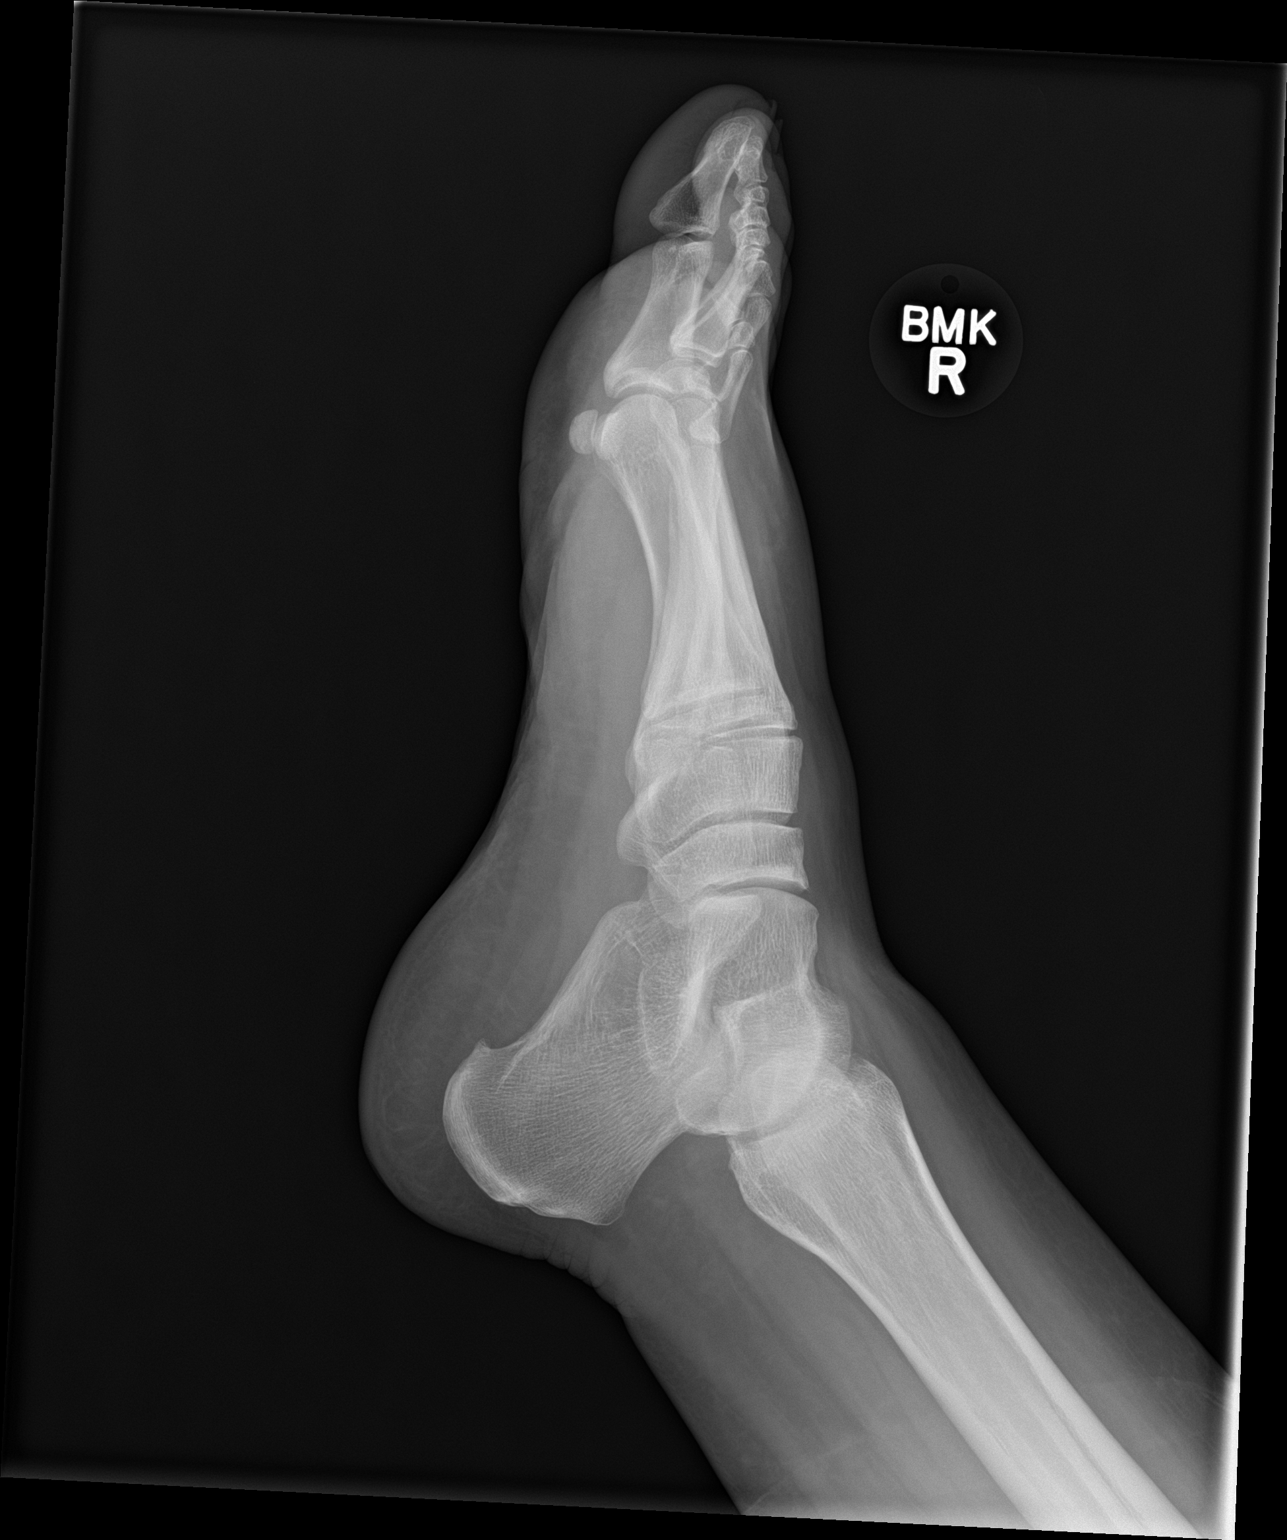

[3 of 3 positions shown; findings below may reference images not displayed]

FINDINGS: There is no evidence of fracture or dislocation. The joint spaces
are preserved. There is no evidence of talar subluxation; the
subtalar joint is unremarkable in appearance.

Dorsal soft tissue swelling is noted at the forefoot.
IMPRESSION: No evidence of fracture or dislocation.

## 2016-12-09 ENCOUNTER — Emergency Department (HOSPITAL_COMMUNITY)
Admission: EM | Admit: 2016-12-09 | Discharge: 2016-12-09 | Disposition: A | Discharge status: Home / Self Care | Payer: Medicaid Other | Attending: Emergency Medicine | Admitting: Emergency Medicine

## 2016-12-09 ENCOUNTER — Encounter (HOSPITAL_COMMUNITY): Payer: Self-pay | Admitting: Emergency Medicine

## 2016-12-09 ENCOUNTER — Emergency Department (HOSPITAL_COMMUNITY): Payer: Medicaid Other

## 2016-12-09 DIAGNOSIS — M21372 Foot drop, left foot: Secondary | ICD-10-CM | POA: Diagnosis not present

## 2016-12-09 DIAGNOSIS — Z5181 Encounter for therapeutic drug level monitoring: Secondary | ICD-10-CM | POA: Diagnosis not present

## 2016-12-09 DIAGNOSIS — Z87891 Personal history of nicotine dependence: Secondary | ICD-10-CM | POA: Diagnosis not present

## 2016-12-09 DIAGNOSIS — Z79899 Other long term (current) drug therapy: Secondary | ICD-10-CM | POA: Insufficient documentation

## 2016-12-09 DIAGNOSIS — R200 Anesthesia of skin: Secondary | ICD-10-CM | POA: Insufficient documentation

## 2016-12-09 DIAGNOSIS — R531 Weakness: Secondary | ICD-10-CM | POA: Diagnosis present

## 2016-12-09 LAB — COMPREHENSIVE METABOLIC PANEL
ALT: 24 U/L (ref 14–54)
AST: 22 U/L (ref 15–41)
Albumin: 3.8 g/dL (ref 3.5–5.0)
Alkaline Phosphatase: 90 U/L (ref 38–126)
Anion gap: 11 (ref 5–15)
BUN: 7 mg/dL (ref 6–20)
CHLORIDE: 104 mmol/L (ref 101–111)
CO2: 23 mmol/L (ref 22–32)
CREATININE: 0.77 mg/dL (ref 0.44–1.00)
Calcium: 9 mg/dL (ref 8.9–10.3)
Glucose, Bld: 96 mg/dL (ref 65–99)
POTASSIUM: 4.3 mmol/L (ref 3.5–5.1)
Sodium: 138 mmol/L (ref 135–145)
TOTAL PROTEIN: 6.8 g/dL (ref 6.5–8.1)
Total Bilirubin: 0.4 mg/dL (ref 0.3–1.2)

## 2016-12-09 LAB — DIFFERENTIAL
BASOS PCT: 0 %
Basophils Absolute: 0 10*3/uL (ref 0.0–0.1)
EOS ABS: 0.1 10*3/uL (ref 0.0–0.7)
Eosinophils Relative: 1 %
LYMPHS ABS: 2.5 10*3/uL (ref 0.7–4.0)
Lymphocytes Relative: 24 %
MONO ABS: 0.7 10*3/uL (ref 0.1–1.0)
MONOS PCT: 7 %
Neutro Abs: 6.8 10*3/uL (ref 1.7–7.7)
Neutrophils Relative %: 68 %

## 2016-12-09 LAB — I-STAT CHEM 8, ED
BUN: 8 mg/dL (ref 6–20)
CREATININE: 0.6 mg/dL (ref 0.44–1.00)
Calcium, Ion: 1.1 mmol/L — ABNORMAL LOW (ref 1.15–1.40)
Chloride: 104 mmol/L (ref 101–111)
GLUCOSE: 120 mg/dL — AB (ref 65–99)
HCT: 45 % (ref 36.0–46.0)
HEMOGLOBIN: 15.3 g/dL — AB (ref 12.0–15.0)
POTASSIUM: 4.2 mmol/L (ref 3.5–5.1)
Sodium: 139 mmol/L (ref 135–145)
TCO2: 25 mmol/L (ref 0–100)

## 2016-12-09 LAB — CBC
HEMATOCRIT: 42.6 % (ref 36.0–46.0)
HEMOGLOBIN: 14 g/dL (ref 12.0–15.0)
MCH: 28.4 pg (ref 26.0–34.0)
MCHC: 32.9 g/dL (ref 30.0–36.0)
MCV: 86.4 fL (ref 78.0–100.0)
Platelets: 236 10*3/uL (ref 150–400)
RBC: 4.93 MIL/uL (ref 3.87–5.11)
RDW: 13.7 % (ref 11.5–15.5)
WBC: 10.1 10*3/uL (ref 4.0–10.5)

## 2016-12-09 LAB — I-STAT TROPONIN, ED: TROPONIN I, POC: 0 ng/mL (ref 0.00–0.08)

## 2016-12-09 LAB — PROTIME-INR
INR: 1.12
Prothrombin Time: 14.5 seconds (ref 11.4–15.2)

## 2016-12-09 LAB — APTT: aPTT: 30 seconds (ref 24–36)

## 2016-12-09 NOTE — Progress Notes (Signed)
Orthopedic Tech Progress Note Patient Details:  Belinda [name] 03-02-1981 [id number] Applied cam boot for er Ortho Devices Type of Ortho Device: Crutches Ortho Device/Splint Location: LLE   Braulio Bosch 12/09/2016, 5:10 PM

## 2016-12-09 NOTE — ED Notes (Signed)
Pt stable, ambulatory, states understanding of discharge instructions 

## 2016-12-09 NOTE — ED Notes (Signed)
Ortho tech paged  

## 2016-12-09 NOTE — Discharge Instructions (Signed)
You were seen in the ED today with weakness in the left ankle. This is likely caused by a compression of a peripheral nerve in the leg. We have placed a boot for comfort and to help with walking. Use your crutches as needed and follow up with your PCP in the coming week. You will need to call the Neurologist and schedule an appointment for 2 weeks from today.   Return to the ED with any worsening weakness, fall, chest pain, difficulty breathing, or severe back pain.

## 2016-12-09 NOTE — Progress Notes (Signed)
Orthopedic Tech Progress Note Patient Details:  Belinda [name] Dec 09, 1980 [id number]  Ortho Devices Type of Ortho Device: Crutches Ortho Device/Splint Location: LLE   Braulio Bosch 12/09/2016, 5:11 PM

## 2016-12-09 NOTE — ED Triage Notes (Signed)
Pt sts weakness in left foot upon waking; pt sts unable to lift foot to walk and was dragging leg; pt noted unable to perform left leg flexion

## 2016-12-09 NOTE — ED Provider Notes (Signed)
Emergency Department Provider Note   I have reviewed the triage vital signs and the nursing notes.   HISTORY  Chief Complaint Weakness   HPI Belinda [name] is a 36 y.o. female with PMH of pre-diabetes presents to the emergency department for evaluation of left ankle weakness that began abruptly this morning. Patient states that she got out of bed and fell to the floor. She denies any back pain. She has some associated numbness on the lower lateral aspect of her left foot. She denies any pain in her hip or knee. She denies wearing any different shoes or high boots. She has been compliant with her medications. No other neurological deficits other than some occasional blurry vision. No injury to the leg. The ankle is not painful.   History reviewed. No pertinent past medical history.  There are no active problems to display for this patient.   Past Surgical History:  Procedure Laterality Date  . ABDOMINAL HYSTERECTOMY      Current Outpatient Rx  . Order #: [id number] Class: Historical Med  . Order #: [id number] Class: Historical Med  . Order #: [id number] Class: Historical Med  . Order #: [id number] Class: Historical Med  . Order #: [id number] Class: Historical Med    Allergies Patient has no known allergies.  History reviewed. No pertinent family history.  Social History Social History  Substance Use Topics  . Smoking status: Former Smoker    Packs/day: 0.50    Types: Cigarettes  . Smokeless tobacco: Never Used  . Alcohol use No    Review of Systems  Constitutional: No fever/chills Eyes: No visual changes. ENT: No sore throat. Cardiovascular: Denies chest pain. Respiratory: Denies shortness of breath. Gastrointestinal: No abdominal pain.  No nausea, no vomiting.  No diarrhea.  No constipation. Genitourinary: Negative for dysuria. Musculoskeletal: Negative for back pain. Skin: Negative for rash. Neurological: Negative for headaches. Left ankle weakness and numbness.    10-point ROS otherwise negative.  ____________________________________________   PHYSICAL EXAM:  VITAL SIGNS: ED Triage Vitals  Enc Vitals Group     BP 12/09/16 1408 159/93     Pulse Rate 12/09/16 1408 99     Resp 12/09/16 1408 20     Temp 12/09/16 1408 98.9 F (37.2 C)     Temp Source 12/09/16 1408 Oral     SpO2 12/09/16 1408 99 %     Weight 12/09/16 1408 273 lb (123.8 kg)     Height 12/09/16 1408 5\' 5"  (1.651 m)     Pain Score 12/09/16 1409 4   Constitutional: Alert and oriented. Well appearing and in no acute distress. Eyes: Conjunctivae are normal. Head: Atraumatic. Nose: No congestion/rhinnorhea. Mouth/Throat: Mucous membranes are moist.  Oropharynx non-erythematous. Neck: No stridor.  Cardiovascular: Normal rate, regular rhythm. Good peripheral circulation. Grossly normal heart sounds.   Respiratory: Normal respiratory effort.  No retractions. Lungs CTAB. Gastrointestinal: Soft and nontender. No distention.  Musculoskeletal: No lower extremity tenderness nor edema. No gross deformities of extremities. Neurologic:  Normal speech and language. Normal left hip and knee strength. Normal plantar flexion of the left foot. No movement with dorsiflexion of the left foot. Decreased sensation to light touch over the lower, lateral leg. No other neurological deficits. Normal patellar reflexes bilaterally.  Skin:  Skin is warm, dry and intact. No rash noted. Psychiatric: Mood and affect are normal. Speech and behavior are normal.  ____________________________________________   LABS (all labs ordered are listed, but only abnormal results are displayed)  Labs Reviewed  I-STAT CHEM 8,  ED - Abnormal; Notable for the following:       Result Value   Glucose, Bld 120 (*)    Calcium, Ion 1.10 (*)    Hemoglobin 15.3 (*)    All other components within normal limits  PROTIME-INR  APTT  CBC  DIFFERENTIAL  COMPREHENSIVE METABOLIC PANEL  I-STAT TROPOININ, ED  CBG MONITORING, ED    ____________________________________________  RADIOLOGY  Ct Head Wo Contrast  Result Date: 12/09/2016 CLINICAL DATA:  Stroke symptoms greater than 8 hours. Weakness in the left foot upon waking. Unable to lift the foot and walk. Dragging the leg. EXAM: CT HEAD WITHOUT CONTRAST TECHNIQUE: Contiguous axial images were obtained from the base of the skull through the vertex without intravenous contrast. COMPARISON:  12/18/2010 FINDINGS: Brain: No evidence of acute infarction, hemorrhage, hydrocephalus, extra-axial collection or mass lesion/mass effect. Vascular: No hyperdense vessel or unexpected calcification. Skull: Normal. Negative for fracture or focal lesion. Sinuses/Orbits: No acute finding. Other: None IMPRESSION: Negative exam. Electronically Signed   By: Nolon Nations M.D.   On: 12/09/2016 15:05    ____________________________________________   PROCEDURES  Procedure(s) performed:   Procedures  None ____________________________________________   INITIAL IMPRESSION / ASSESSMENT AND PLAN / ED COURSE  Pertinent labs & imaging results that were available during my care of the patient were reviewed by me and considered in my medical decision making (see chart for details).  Patient resents to the emergency department for evaluation of left ankle weakness and lateral numbness. Symptoms are consistent with a peripheral neuropathy. Symptoms were abrupt in onset. Patient has no back pain or weakness in other muscle groups to suggest more central etiology of her weakness and numbness. Suspect compression of the peroneal nerve and resulting foot drop as the ultimate cause of her symptoms. Plan for splinting in dorsiflexion with crutches and primary care physician follow-up. We will discuss the case with neurology to decide on follow-up plan.   Spoke with Neurology Dr. Alver Fisher regarding the case. Will place with splint/boot and have the patient follow up with Neurology in 2 weeks for  re-evaluation. Patient will also follow with PCP. Discussed care plan and follow up instructions.   At this time, I do not feel there is any life-threatening condition present. I have reviewed and discussed all results (EKG, imaging, lab, urine as appropriate), exam findings with patient. I have reviewed nursing notes and appropriate previous records.  I feel the patient is safe to be discharged home without further emergent workup. Discussed usual and customary return precautions. Patient and family (if present) verbalize understanding and are comfortable with this plan.  Patient will follow-up with their primary care provider. If they do not have a primary care provider, information for follow-up has been provided to them. All questions have been answered.  ____________________________________________  FINAL CLINICAL IMPRESSION(S) / ED DIAGNOSES  Final diagnoses:  Left foot drop     MEDICATIONS GIVEN DURING THIS VISIT:  None  NEW OUTPATIENT MEDICATIONS STARTED DURING THIS VISIT:  None   Note:  This document was prepared using Dragon voice recognition software and may include unintentional dictation errors.  Nanda Quinton, MD Emergency Medicine   Margette Fast, MD 12/10/16 [phone]

## 2016-12-13 ENCOUNTER — Ambulatory Visit: Payer: Self-pay | Admitting: Neurology

## 2016-12-15 ENCOUNTER — Encounter: Payer: Self-pay | Admitting: Neurology

## 2016-12-15 ENCOUNTER — Ambulatory Visit (INDEPENDENT_AMBULATORY_CARE_PROVIDER_SITE_OTHER): Payer: Medicaid Other | Admitting: Neurology

## 2016-12-15 DIAGNOSIS — G5702 Lesion of sciatic nerve, left lower limb: Secondary | ICD-10-CM

## 2016-12-15 DIAGNOSIS — R7309 Other abnormal glucose: Secondary | ICD-10-CM | POA: Insufficient documentation

## 2016-12-15 NOTE — Progress Notes (Signed)
PATIENT: Belinda [name] DOB: December 11, 1980  Chief Complaint  Patient presents with  . Left foot numbness    Reports left lower extremity numbness, tingling, burning, weakness and inability to lift toes.  She woke up with these symptoms on 12/09/16, after sleeping in a recliner all night.  Marland Kitchen PCP    Leonides Sake, MD - referred from ED     Erath is a 36 year old right-handed female, seen in refer by her primary care physician Dr.  Lorin [facility] for evaluation of left foot numbness, initial evaluation was on December 15 2016.  She had a past medical history of depression anxiety, hyperlipidemia, prediabetes, work as a Barista  She woke up at [place] on Feb 8th 2018 after sleeping at recliner for few hours, 4am, when she was trying to get up from the recliner, she fell hit the floor, she noticed left ankle dorsiflexion weakness, numbness at left lateral leg extending to dorsum of left foot.  She presented to the emergency room,I reviewed laboratory evaluation on February eighth 2018, normal CMP, CBC, hemoglobin of 14.0, INR 1.12, troponin was negative  She denies left lateral knee pain, no low back pain, no right leg involvement, no bilateral upper extremity involvement. She denies bowel and bladder incontinence.  Over the past few days, her symptoms showed 20% improvement  REVIEW OF SYSTEMS: Full 14 system review of systems performed and notable only for weight gain, fatigue, palpitation, blurred vision, snoring, increased thirst, headache, numbness, weakness, snoring, depression, anxiety.  ALLERGIES: No Known Allergies  HOME MEDICATIONS: Current Outpatient Prescriptions  Medication Sig Dispense Refill  . acetaminophen (TYLENOL) 500 MG tablet Take 1,000 mg by mouth every 6 (six) hours as needed.    . cyclobenzaprine (FLEXERIL) 5 MG tablet Take 5 mg by mouth 3 (three) times daily as needed for muscle spasms.    Marland Kitchen FLUoxetine (PROZAC) 40 MG capsule Take 40 mg by  mouth daily.    Marland Kitchen ibuprofen (ADVIL,MOTRIN) 200 MG tablet Take 400 mg by mouth every 6 (six) hours as needed.    . pseudoephedrine (SUDAFED) 120 MG 12 hr tablet Take 120 mg by mouth daily as needed for congestion.     No current facility-administered medications for this visit.     PAST MEDICAL HISTORY: Past Medical History:  Diagnosis Date  . Depression with anxiety   . Hyperlipemia   . Numbness and tingling of left lower extremity   . Pre-diabetes     PAST SURGICAL HISTORY: Past Surgical History:  Procedure Laterality Date  . ABDOMINAL HYSTERECTOMY    . DILATION AND CURETTAGE OF UTERUS      FAMILY HISTORY: Family History  Problem Relation Age of Onset  . Arthritis Mother   . Heart failure Father   . COPD Father   . Diabetes Father   . ALS Paternal Aunt   . ALS Cousin   . ALS Cousin   . Breast cancer Maternal Grandmother   . Stroke Maternal Grandmother   . Suicidality Maternal Grandfather   . Stroke Paternal Grandmother   . Stroke Paternal Grandfather     SOCIAL HISTORY:  Social History   Social History  . Marital status: Married    Spouse name: N/A  . Number of children: 2  . Years of education: GED   Occupational History  . Retail store clerk    Social History Main Topics  . Smoking status: Former Smoker    Packs/day: 0.50  Types: Cigarettes  . Smokeless tobacco: Never Used     Comment: Quit 2014  . Alcohol use No  . Drug use: No  . Sexual activity: Yes    Birth control/ protection: Surgical   Other Topics Concern  . Not on file   Social History Narrative   Lives at home with husband and children.   Right-handed.   3 cups caffeine per day.     PHYSICAL EXAM   Vitals:   12/15/16 0941  BP: 140/82  Pulse: 93  Weight: 271 lb (122.9 kg)  Height: 5\' 5"  (1.651 m)    Not recorded      Body mass index is 45.1 kg/m.  PHYSICAL EXAMNIATION:  Gen: NAD, conversant, well nourised, obese, well groomed                     Cardiovascular:  Regular rate rhythm, no peripheral edema, warm, nontender. Eyes: Conjunctivae clear without exudates or hemorrhage Neck: Supple, no carotid bruits. Pulmonary: Clear to auscultation bilaterally   NEUROLOGICAL EXAM:  MENTAL STATUS: Speech:    Speech is normal; fluent and spontaneous with normal comprehension.  Cognition:     Orientation to time, place and person     Normal recent and remote memory     Normal Attention span and concentration     Normal Language, naming, repeating,spontaneous speech     Fund of knowledge   CRANIAL NERVES: CN II: Visual fields are full to confrontation. Fundoscopic exam is normal with sharp discs and no vascular changes. Pupils are round equal and briskly reactive to light. CN III, IV, VI: extraocular movement are normal. No ptosis. CN V: Facial sensation is intact to pinprick in all 3 divisions bilaterally. Corneal responses are intact.  CN VII: Face is symmetric with normal eye closure and smile. CN VIII: Hearing is normal to rubbing fingers CN IX, X: Palate elevates symmetrically. Phonation is normal. CN XI: Head turning and shoulder shrug are intact CN XII: Tongue is midline with normal movements and no atrophy.  MOTOR: She has moderate left ankle dorsi flexion weakness, mild left ankle eversion weakness, numbness extending from left lateral leg to the top of left foot,  REFLEXES: Reflexes are 2+ and symmetric at the biceps, triceps, knees, and ankles. Plantar responses are flexor.  SENSORY: Decreased light touch pinprick, and allodynia extending from left lateral leg to the top of left foot, involving the first web space.  COORDINATION: Rapid alternating movements and fine finger movements are intact. There is no dysmetria on finger-to-nose and heel-knee-shin.    GAIT/STANCE: She ambulate with left foot drop, could not stand up on left heels, mild difficulty stand on left tiptoe  DIAGNOSTIC DATA (LABS, IMAGING, TESTING) - I reviewed patient  records, labs, notes, testing and imaging myself where available.   ASSESSMENT AND PLAN  Belinda [name] is a 36 y.o. female   Left common peroneal neuropathy  Likely due to compression from left fibular head  EMG nerve conduction study    History of abnormal glucose  Laboratory evaluations, includes A1c, B12.  Marcial Pacas, M.D. Ph.D.  [facility] [address], [place], [place] [address] Ph: [phone] Fax: [phone]  [id number] Ranae Pila, MD

## 2016-12-16 LAB — TSH: TSH: 0.621 u[IU]/mL (ref 0.450–4.500)

## 2016-12-16 LAB — ANA W/REFLEX IF POSITIVE: Anti Nuclear Antibody(ANA): NEGATIVE

## 2016-12-16 LAB — SEDIMENTATION RATE: Sed Rate: 21 mm/hr (ref 0–32)

## 2016-12-16 LAB — HGB A1C W/O EAG: HEMOGLOBIN A1C: 5.5 % (ref 4.8–5.6)

## 2016-12-16 LAB — RPR: RPR Ser Ql: NONREACTIVE

## 2016-12-16 LAB — C-REACTIVE PROTEIN: CRP: 10.4 mg/L — ABNORMAL HIGH (ref 0.0–4.9)

## 2016-12-16 LAB — VITAMIN B12: Vitamin B-12: 252 pg/mL (ref 232–1245)

## 2016-12-17 ENCOUNTER — Ambulatory Visit (INDEPENDENT_AMBULATORY_CARE_PROVIDER_SITE_OTHER): Payer: Medicaid Other | Admitting: Neurology

## 2016-12-17 ENCOUNTER — Encounter (INDEPENDENT_AMBULATORY_CARE_PROVIDER_SITE_OTHER): Payer: Self-pay | Admitting: Neurology

## 2016-12-17 DIAGNOSIS — G5702 Lesion of sciatic nerve, left lower limb: Secondary | ICD-10-CM | POA: Diagnosis not present

## 2016-12-17 DIAGNOSIS — Z0289 Encounter for other administrative examinations: Secondary | ICD-10-CM

## 2016-12-17 DIAGNOSIS — R7309 Other abnormal glucose: Secondary | ICD-10-CM

## 2016-12-17 NOTE — Procedures (Signed)
Full Name: Belinda [name] Gender: Female MRN #: [id number] Date of Birth: May 11, 1981    Visit Date: 12/17/2016 08:11 Age: 36 Years 46 Months Old Examining Physician: Marcial Pacas, MD  Referring Physician: Krista Blue History: 36 year old female presented with left foot drop in one week, she rubbing her left lateral knee against her vehicle door while driving, has mild tenderness at left fibula head.  On examination: Left ankle dorsiflexion 4, eversion 4, inversion 5, plantar flexion 5, deep tendon reflexes of bilateral lower extremity was present, and symmetric, she complains of decreased light touch, allodynia at top of left foot, involving first web space, extending to left lateral leg.  Summary of test:  Nerve conduction study: Left sural sensory responses showed mildly decreased snap amplitude in comparison with right superficial peroneal sensory response, but the snap amplitude is still within normal limits. Left sural sensory response was normal.  Left tibial, right peroneal to EDB motor responses were normal.  Left peroneal to EDB motor response showed mildly decreased to C map amplitude in comparison to the right side, but still within normal limit. There was 70% amplitude drop across the fibular head, 29 m/s velocity job across left fibular head.  Electromyography: Selective needle examination of left lower extremity muscles and left lumbosacral paraspinal muscles was performed.  There was evidence of decreased recruitment at left tibialis anterior, peroneal longus, there was no evidence of active denervation.  Conclusion: This is an abnormal study. There is electrodiagnostic evidence of left common peroneal neuropathy, demyelinating in nature, localized to left fibular head. There was no evidence of axonal loss today'sstudy.    ------------------------------- Marcial Pacas M.D.  [facility] [place],  [address] Tel: [phone] Fax:  [phone]        [facility]    Nerve / Sites Rec. Site Peak Lat Ref.  Amp Ref. Segments Distance    ms ms V V  cm  L Superficial peroneal - Ankle     Lat leg Ankle 3.8 ?4.4 6 ?6 Lat leg - Ankle 14  L Sural - Ankle (Calf)     Calf Ankle 2.6 ?4.4 27 ?6 Calf - Ankle 14  R Superficial peroneal - Ankle     Lat leg Ankle 3.8 ?4.4 11 ?6 Lat leg - Ankle 14           [facility]    Nerve / Sites Muscle Latency Ref. Amplitude Ref. Rel Amp Segments Distance Lat Diff Velocity Ref. Area    ms ms mV mV %  cm ms m/s m/s mVms  L Tibial - AH     Ankle AH 3.4 ?5.8 16.0 ?4.0 100 Ankle - AH 9    30.9     Pop fossa AH 12.0  10.1  63 Pop fossa - Ankle 39 8.5 46 ?41 22.6  L Peroneal - EDB     Ankle EDB 3.9 ?6.5 2.6 ?2.0 100 Ankle - EDB 9    8.7     below fibular 24cm EDB 9.8  2.1  80.7 below fibular 24cm - Ankle 24 5.9 40 ?44 6.3     below fib 25cm EDB 9.8  2.1  97.4 Pop fossa - below fib 25cm 2 1.9 11  6.2     Pop fossa below fib 29 11.7  0.6  30.4 below fib 25cm - below fibular 24cm  0.1   2.4     7 TA 13.5  0.3  45.1 7 - Pop fossa  1.8   0.8     8 TA 13.8  0.3  100 8 - 7  0.3   0.7  R Peroneal - EDB     Ankle EDB 3.9 ?6.5 3.3 ?2.0 100 Ankle - EDB 9    9.6     Fib head EDB 9.3  2.8  83.6 Fib head - Ankle 27 5.4 50 ?44 9.6     Pop fossa EDB 11.3  2.7  96.8 Pop fossa - Fib head 10 1.9 52 ?44 9.6         Pop fossa - Ankle  7.3              F  Wave    Nerve F Lat Ref.   ms ms  L Tibial - AH 48.1 ?56.0       EMG full       EMG Summary Table    Spontaneous MUAP Recruitment  Muscle IA Fib PSW Fasc Other Amp Dur. Poly Pattern  L. Tibialis anterior Normal None None None _______ Normal Normal Normal Reduced  L. Tibialis posterior Normal None None None _______ Normal Normal Normal Normal  L. Peroneus longus Normal None None None _______ Normal Normal Normal Reduced  L. Vastus lateralis Normal None None None _______ Normal Normal Normal Normal  L. Gluteus medius Normal None None None _______ Normal Normal  Normal Normal  L. Lumbar paraspinals (mid) Normal None None None _______ Normal Normal Normal Normal  L. Lumbar paraspinals (low) Normal None None None _______ Normal Normal Normal Normal

## 2017-10-06 DIAGNOSIS — G5600 Carpal tunnel syndrome, unspecified upper limb: Secondary | ICD-10-CM | POA: Insufficient documentation

## 2018-03-20 ENCOUNTER — Encounter: Payer: Self-pay | Admitting: Physician Assistant

## 2018-04-11 ENCOUNTER — Encounter: Payer: Self-pay | Admitting: Physician Assistant

## 2018-04-11 ENCOUNTER — Ambulatory Visit: Payer: Medicaid Other | Admitting: Physician Assistant

## 2018-04-11 VITALS — BP 132/86 | HR 90 | Ht 65.0 in | Wt 284.2 lb

## 2018-04-11 DIAGNOSIS — R079 Chest pain, unspecified: Secondary | ICD-10-CM | POA: Diagnosis not present

## 2018-04-11 DIAGNOSIS — R0602 Shortness of breath: Secondary | ICD-10-CM | POA: Diagnosis not present

## 2018-04-11 DIAGNOSIS — R011 Cardiac murmur, unspecified: Secondary | ICD-10-CM

## 2018-04-11 LAB — BASIC METABOLIC PANEL
BUN / CREAT RATIO: 14 (ref 9–23)
BUN: 10 mg/dL (ref 6–20)
CO2: 22 mmol/L (ref 20–29)
CREATININE: 0.7 mg/dL (ref 0.57–1.00)
Calcium: 8.4 mg/dL — ABNORMAL LOW (ref 8.7–10.2)
Chloride: 101 mmol/L (ref 96–106)
GFR, EST AFRICAN AMERICAN: 127 mL/min/{1.73_m2} (ref >59)
GFR, EST NON AFRICAN AMERICAN: 110 mL/min/{1.73_m2} (ref >59)
Glucose: 104 mg/dL — ABNORMAL HIGH (ref 65–99)
Potassium: 4.2 mmol/L (ref 3.5–5.2)
Sodium: 139 mmol/L (ref 134–144)

## 2018-04-11 MED ORDER — METOPROLOL TARTRATE 50 MG PO TABS: 50.0000 mg | ORAL_TABLET | Freq: Once | ORAL | 0 refills | Status: DC

## 2018-04-11 MED ORDER — METOPROLOL SUCCINATE ER 50 MG PO TB24: 50.0000 mg | ORAL_TABLET | Freq: Once | ORAL | 0 refills | Status: DC

## 2018-04-11 NOTE — Progress Notes (Signed)
Cardiology Office Note:    Date:  04/11/2018   ID:  Belinda [name], DOB 12-20-1980, MRN [id number]  PCP:  Practice, [facility]  Cardiologist:  New - Prefers Dr. Sherren Mocha who took care of her father   Referring MD: Philmore Pali, NP   Chief Complaint  Patient presents with  . Chest Pain    History of Present Illness:    Belinda [name] is a 37 y.o. female ex-smoker with glucose intolerance, FHx CAD, obesity who is being seen today for the evaluation of chest pain at the request of Philmore Pali, NP.   Belinda [name] is here alone today.  She quit smoking in 2014 (after 19 years) and gained 60 lbs. Since then, she has noted more dyspnea on exertion with mild to mod activities.  She recently has started to notice substernal chest pain described as pressure with radiation to her L scapula.  She did have pain in her L shoulder on one occasion.  Most of her chest pain has been with exertion.  She did have one episode of discomfort and shortness of breath while driving a car. This seemed to be related to being in a certain position. She denies syncope.  She sleeps in a recliner due to sinus congestion.  She denies paroxysmal nocturnal dyspnea. She does have some leg swelling.  When she has chest pain and shortness of breath, she feels hot and sometimes has visual changes.    PAD Screen 04/11/2018  Previous PAD dx? No  Previous surgical procedure? No  Pain with walking? No  Feet/toe relief with dangling? No  Painful, non-healing ulcers? No  Extremities discolored? No    Prior CV studies:   The following studies were reviewed today:  None   Past Medical History:  Diagnosis Date  . Allergic rhinitis   . Asthma    better after quitting smoking  . Depression with anxiety   . Elevated blood pressure reading    occasional elevated BPs; no medication yet  . Fibrocystic breast changes   . GERD (gastroesophageal reflux disease)   . Hyperlipemia    managed with diet  . Muscle spasm of  back   . Numbness and tingling of left lower extremity   . Obesity   . Panic disorder   . Prediabetes   . PVC's (premature ventricular contractions) 03/21/2007   has worn several monitors; 1st age 37  . Status post complete hysterectomy     Past Surgical History:  Procedure Laterality Date  . ABDOMINAL HYSTERECTOMY    . DILATION AND CURETTAGE OF UTERUS      Current Medications: Current Meds  Medication Sig  . ALPRAZolam (XANAX) 0.5 MG tablet Take 0.5 mg by mouth 3 (three) times daily as needed for anxiety.  . cyclobenzaprine (FLEXERIL) 5 MG tablet Take 5 mg by mouth 3 (three) times daily as needed for muscle spasms.  Marland Kitchen FLUoxetine (PROZAC) 40 MG capsule Take 40 mg by mouth daily.  Marland Kitchen ibuprofen (ADVIL,MOTRIN) 200 MG tablet Take 400 mg by mouth every 6 (six) hours as needed for moderate pain.   Marland Kitchen omeprazole (PRILOSEC OTC) 20 MG tablet Take 20 mg by mouth daily.  . pseudoephedrine (SUDAFED) 120 MG 12 hr tablet Take 120 mg by mouth daily as needed for congestion.     Allergies:   Bupropion; Buspar [buspirone]; Cymbalta [duloxetine hcl]; Pristiq [desvenlafaxine]; and Zoloft [sertraline]   Social History   Socioeconomic History  . Marital status: Married  Spouse name: Not on file  . Number of children: 2  . Years of education: GED  . Highest education level: Not on file  Occupational History  . Occupation: Barista  Social Needs  . Financial resource strain: Not on file  . Food insecurity:    Worry: Not on file    Inability: Not on file  . Transportation needs:    Medical: Not on file    Non-medical: Not on file  Tobacco Use  . Smoking status: Former Smoker    Packs/day: 0.50    Types: Cigarettes  . Smokeless tobacco: Never Used  . Tobacco comment: Quit 2014  Substance and Sexual Activity  . Alcohol use: No  . Drug use: No  . Sexual activity: Yes    Birth control/protection: Surgical  Lifestyle  . Physical activity:    Days per week: Not on file     Minutes per session: Not on file  . Stress: Not on file  Relationships  . Social connections:    Talks on phone: Not on file    Gets together: Not on file    Attends religious service: Not on file    Active member of club or organization: Not on file    Attends meetings of clubs or organizations: Not on file    Relationship status: Not on file  Other Topics Concern  . 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   Right-handed.   3 cups caffeine per day.     Family Hx: The patient's family history includes ALS in her cousin, cousin, and paternal aunt; Arthritis in her mother; Breast cancer in her maternal grandmother; COPD in her father; Diabetes in her father; Heart attack in her maternal grandmother; Heart attack (age of onset: 52) in her father; Heart failure in her father; Stroke in her maternal grandmother, paternal grandfather, and paternal grandmother; Suicidality in her maternal grandfather.  ROS:   Please see the history of present illness.    Review of Systems  Cardiovascular: Positive for chest pain, dyspnea on exertion, leg swelling and palpitations.  Respiratory: Positive for snoring and wheezing.   Musculoskeletal: Positive for joint pain.  Neurological: Positive for headaches.  Psychiatric/Behavioral: Positive for depression. The patient is nervous/anxious.    All other systems reviewed and are negative.   EKGs/Labs/Other Test Reviewed:    EKG:  EKG is  ordered today.  The ekg ordered today demonstrates normal sinus rhythm, heart rate 90, normal axis, QTC 437  Recent Labs: 04/11/2018: BUN 10; Creatinine, Ser 0.70; Potassium 4.2; Sodium 139   Recent Lipid Panel No results found for: CHOL, TRIG, HDL, CHOLHDL, LDLCALC, LDLDIRECT  Physical Exam:    VS:  BP 132/86   Pulse 90   Ht 5\' 5"  (1.651 m)   Wt 284 lb 3.2 oz (128.9 kg)   BMI 47.29 kg/m     Wt  Readings from Last 3 Encounters:  04/11/18 284 lb 3.2 oz (128.9 kg)  12/15/16 271 lb (122.9 kg)  12/09/16 273 lb (123.8 kg)     Physical Exam  Constitutional: She is oriented to person, place, and time. She appears well-developed and well-nourished. No distress.  HENT:  Head: Normocephalic and atraumatic.  Neck: No JVD present. Carotid bruit is not present.  Cardiovascular: Normal rate and regular rhythm.  Murmur heard.  Early systolic murmur is  present with a grade of 1/6 at the upper right sternal border. Pulmonary/Chest: Effort normal and breath sounds normal. She has no rales.  Abdominal: Soft. There is no hepatomegaly.  Musculoskeletal: She exhibits no edema.  Neurological: She is alert and oriented to person, place, and time.  Skin: Skin is warm and dry.    ASSESSMENT & PLAN:    Exertional chest pain She presents for evaluation of exertional chest discomfort and shortness of breath.  Risk factors for coronary artery disease include prior smoking, family history and glucose intolerance ("prediabetes").  Stress testing would likely be difficult to interpret based upon her size.  I suspect her nuclear images will have a lot of artifact.  As she does have exertional symptoms that are suggestive of angina, a plain exercise treadmill test would not be adequate.  I reviewed her case today with Dr. Lovena Le.  We agreed that she should undergo coronary CTA to rule out the possibility of obstructive coronary artery disease.  -Arrange coronary CTA +/- FFR  Shortness of breath Likely related to obesity.  No evidence of volume excess on exam.  Obtain coronary CTA as noted.  Also obtain echocardiogram.  Murmur She has a benign sounding murmur on exam.  However, given her shortness of breath, echocardiogram will be obtained.  Morbid obesity ([place])   We briefly discussed options for weight loss.  She needs to wait until her coronary CTA is completed.  If this is normal, she can slowly increase  aerobic activity.  I cautioned her against "fad diets."   Dispo:  Return As needed depending upon results of testing..   Medication Adjustments/Labs and Tests Ordered: Current medicines are reviewed at length with the patient today.  Concerns regarding medicines are outlined above.  Orders/Tests:  Orders Placed This Encounter  Procedures  . CT CORONARY MORPH W/CTA COR W/SCORE W/CA W/CM &/OR WO/CM  . CT CORONARY FRACTIONAL FLOW RESERVE DATA PREP  . CT CORONARY FRACTIONAL FLOW RESERVE FLUID ANALYSIS  . Basic Metabolic Panel (BMET)  . EKG 12-Lead  . ECHOCARDIOGRAM COMPLETE   Medication changes: Meds ordered this encounter  Medications  . DISCONTD: metoprolol succinate (TOPROL-XL) 50 MG 24 hr tablet    Sig: Take 1 tablet (50 mg total) by mouth once for 1 dose. Take with or immediately following a meal.    Dispense:  1 tablet    Refill:  0    THIS IS TO BE TAKEN 1 HOUR PRIOR TO CT  . metoprolol tartrate (LOPRESSOR) 50 MG tablet    Sig: Take 1 tablet (50 mg total) by mouth once for 1 dose.    Dispense:  1 tablet    Refill:  0    THIS IS TO BE TAKEN 1 HOUR BEFORE CT   Signed, Richardson Dopp, PA-C  04/11/2018 4:29 PM    [facility] [place], [place], [place]  [address] Phone: [phone]; Fax: [phone]

## 2018-04-11 NOTE — Patient Instructions (Addendum)
Medication Instructions:  1. Your physician recommends that you continue on your current medications as directed. Please refer to the Current Medication list given to you today.   Labwork: BMET TODAY  Testing/Procedures: CARDIAC CT-A TO BE DONE AT [facility]  Your physician has requested that you have an echocardiogram. Echocardiography is a painless test that uses sound waves to create images of your heart. It provides your doctor with information about the size and shape of your heart and how well your heart's chambers and valves are working. This procedure takes approximately one hour. There are no restrictions for this procedure.   Follow-Up: DR. Nida Boatman AS NEEDED PENDING TEST RESULTS.   Any Other Special Instructions Will Be Listed Below (If Applicable).  If you need a refill on your cardiac medications before your next appointment, please call your pharmacy.    Please arrive at the [facility] main entrance of [facility] at xx:xx AM (30-45 minutes prior to test start time)  [facility] [place], [place] [address] [phone]  Proceed to the [facility] Radiology Department (First Floor).  Please follow these instructions carefully (unless otherwise directed):  On the Night Before the Test: . Drink plenty of water. . Do not consume any caffeinated/decaffeinated beverages or chocolate 12 hours prior to your test. . Do not take any antihistamines 12 hours prior to your test. On the Day of the Test: . Drink plenty of water. Do not drink any water within one hour of the test. . Do not eat any food 4 hours prior to the test. . You may take your regular medications prior to the test. . IF NOT ON A BETA BLOCKER - Take 50 mg of lopressor (metoprolol) one hour before the test. After the Test: . Drink plenty of water. . After receiving IV contrast, you may experience a mild flushed feeling. This is normal. . On  occasion, you may experience a mild rash up to 24 hours after the test. This is not dangerous. If this occurs, you can take Benadryl 25 mg and increase your fluid intake. . If you experience trouble breathing, this can be serious. If it is severe call 911 IMMEDIATELY. If it is mild, please call our office. . If you take any of these medications: Glipizide/Metformin, Avandament, Glucavance, please do not take 48 hours after completing test.

## 2018-04-13 ENCOUNTER — Other Ambulatory Visit: Payer: Self-pay

## 2018-04-13 ENCOUNTER — Ambulatory Visit (HOSPITAL_COMMUNITY): Payer: Medicaid Other | Attending: Cardiology

## 2018-04-13 DIAGNOSIS — R079 Chest pain, unspecified: Secondary | ICD-10-CM | POA: Diagnosis not present

## 2018-04-13 MED ORDER — PERFLUTREN LIPID MICROSPHERE
1.0000 mL | INTRAVENOUS | Status: AC | PRN
  Administered 2018-04-13: 2 mL via INTRAVENOUS

## 2018-04-14 ENCOUNTER — Encounter: Payer: Self-pay | Admitting: Physician Assistant

## 2018-04-14 ENCOUNTER — Telehealth: Payer: Self-pay | Admitting: *Deleted

## 2018-04-14 DIAGNOSIS — R0683 Snoring: Secondary | ICD-10-CM

## 2018-04-14 DIAGNOSIS — R0602 Shortness of breath: Secondary | ICD-10-CM

## 2018-04-14 MED ORDER — METOPROLOL SUCCINATE ER 25 MG PO TB24: 25.0000 mg | ORAL_TABLET | Freq: Every day | ORAL | 3 refills | Status: AC

## 2018-04-14 NOTE — Telephone Encounter (Signed)
Left message to go over echo results and recommendations.  

## 2018-04-14 NOTE — Telephone Encounter (Signed)
Follow up    Patient returning call for results

## 2018-04-14 NOTE — Telephone Encounter (Signed)
-----   Message from Liliane Shi, [place] sent at 04/14/2018  8:10 AM EDT ----- Please call the patient. The echocardiogram demonstrates normal heart function (ejection fraction), lung artery pressures at the upper limits of normal. The heart does squeeze vigorously and this is sometimes the cause of symptoms of shortness of breath.  She may benefit from the addition of beta-blocker therapy to improve shortness of breath.  Also, with lung pressures at the upper limits of normal, question if she may have sleep apnea. PLAN:  1.  Start Toprol-XL 25 mg daily 2.  On the day of her CT, she can take the metoprolol tartrate 50 mg instead of the Toprol-XL 3.  Ask patient if she snores; if yes arrange sleep study 4.  Arrange follow-up with me in 6-8 weeks to review response to med, review results of testing Please fax a copy to PCP:  Practice, [facility], [place]    04/14/2018 8:01 AM

## 2018-04-14 NOTE — Telephone Encounter (Signed)
-----   Message from Liliane Shi, [place] sent at 04/14/2018  8:10 AM EDT ----- Please call the patient. The echocardiogram demonstrates normal heart function (ejection fraction), lung artery pressures at the upper limits of normal. The heart does squeeze vigorously and this is sometimes the cause of symptoms of shortness of breath.  She may benefit from the addition of beta-blocker therapy to improve shortness of breath.  Also, with lung pressures at the upper limits of normal, question if she may have sleep apnea. PLAN:  1.  Start Toprol-XL 25 mg daily 2.  On the day of her CT, she can take the metoprolol tartrate 50 mg instead of the Toprol-XL 3.  Ask patient if she snores; if yes arrange sleep study 4.  Arrange follow-up with me in 6-8 weeks to review response to med, review results of testing Please fax a copy to PCP:  Practice, [facility], [place]    04/14/2018 8:01 AM

## 2018-04-14 NOTE — Telephone Encounter (Signed)
Pt has been informed of echo results by phone with verbal understanding. Pt states she does snore. Pt states she has had a sleep study in the past approx 1-2 yrs ago. Advised we should go ahead and repeat sleep study since it has been over 1 yr. Pt is agreeable to start Toprol XL 25 mg daily. I did go over the instructions when she has her CT that she will take the Metoprolol Tartrate 50 mg 1 hour before. I explained the difference between the Tartrate and XL. Pt has been scheduled as of today to see Richardson Dopp, PA 7/31 10:45 for 6-8 week. I will send a message to CT scheduler to see when we can get the pt scheduled for her CT, order is in [place]. I will fax a copy of echo results to PCP [facility]. Pt verbalized understanding and is agreeable to plan of care. I will send in Rx for Metoprolol Succ 25 mg daily.

## 2018-04-14 NOTE — Addendum Note (Signed)
Addended by: Michae Kava on: 04/14/2018 10:36 AM   Modules accepted: Orders

## 2018-04-21 ENCOUNTER — Telehealth: Payer: Self-pay | Admitting: *Deleted

## 2018-04-21 NOTE — Telephone Encounter (Signed)
Patient is scheduled for lab study on 04/30/18. Patient understands her sleep study will be done at [facility] sleep lab. Patient understands she will receive a sleep packet in a week or so. Patient understands to call if she does not receive the sleep packet in a timely manner. Patient agrees with treatment and thanked me for call.

## 2018-04-21 NOTE — Telephone Encounter (Signed)
-----   Message from Lauralee Evener, [place] sent at 04/17/2018  2:04 PM EDT ----- Regarding: RE: PRE CERT Medicaid does not require precert. Ok to schedule. ----- Message ----- From: Freada Bergeron, CMA Sent: 04/14/2018  11:17 AM To: [facility] Studies Subject: PRE CERT                                         ----- Message ----- From: Michae Kava, CMA Sent: 04/14/2018  10:36 AM To: Freada Bergeron, CMA Subject: SPLIT NIGHT SLEEP                              Hi Nina,  I s/w pt today over the phone and Nicki Reaper would like for her to have split night sleep study. I did not go over any of the sleep questions. Will you please call her and schedule for sleep study. Dx shortness of breath and snoring.  Thank you  Arbie Cookey

## 2018-04-30 ENCOUNTER — Encounter (HOSPITAL_BASED_OUTPATIENT_CLINIC_OR_DEPARTMENT_OTHER): Payer: Medicaid Other

## 2018-05-19 ENCOUNTER — Encounter (HOSPITAL_BASED_OUTPATIENT_CLINIC_OR_DEPARTMENT_OTHER): Payer: Medicaid Other

## 2018-05-31 ENCOUNTER — Ambulatory Visit: Payer: Medicaid Other | Admitting: Physician Assistant

## 2018-10-17 ENCOUNTER — Ambulatory Visit: Payer: Self-pay | Admitting: Urology

## 2018-10-17 ENCOUNTER — Encounter: Payer: Self-pay | Admitting: Urology

## 2018-10-17 NOTE — Progress Notes (Deleted)
10/17/2018 12:07 PM   Belinda [name] [birth year] [id number]  Referring provider: Practice, [place],  [id number]  No chief complaint on file.   HPI:    PMH: Past Medical History:  Diagnosis Date  . Allergic rhinitis   . Asthma    better after quitting smoking  . Depression with anxiety   . Elevated blood pressure reading    occasional elevated BPs; no medication yet  . Fibrocystic breast changes   . GERD (gastroesophageal reflux disease)   . History of echocardiogram    Echo 6/19:  Mild conc LVH, EF 65-70, normal wall motion, normal diastolic function, mild TR, PASP upper limits of normal  . Hyperlipemia    managed with diet  . Muscle spasm of back   . Numbness and tingling of left lower extremity   . Obesity   . Panic disorder   . Prediabetes   . PVC's (premature ventricular contractions) 03/21/2007   has worn several monitors; 1st age 37  . Status post complete hysterectomy     Surgical History: Past Surgical History:  Procedure Laterality Date  . ABDOMINAL HYSTERECTOMY    . DILATION AND CURETTAGE OF UTERUS      Home Medications:  Allergies as of 10/17/2018      Reactions   Bupropion Other (See Comments)   HEART PALPITATION, PAIN ATTACKS   Buspar [buspirone] Other (See Comments)   HEART PALPITATION, PAIN ATTACKS   Cymbalta [duloxetine Hcl] Other (See Comments)   HEART PALPITATION, PAIN ATTACKS   Pristiq [desvenlafaxine] Other (See Comments)   HEART PALPITATION, PAIN ATTACKS   Zoloft [sertraline] Other (See Comments)   HEART PALPITATION, PAIN ATTACKS      Medication List       Accurate as of October 17, 2018 12:07 PM. Always use your most recent med list.        ALPRAZolam 0.5 MG tablet Commonly known as:  XANAX Take 0.5 mg by mouth 3 (three) times daily as needed for anxiety.   cyclobenzaprine 5 MG tablet Commonly known as:  FLEXERIL Take 5 mg by mouth 3 (three) times daily as needed for muscle  spasms.   FLUoxetine 40 MG capsule Commonly known as:  PROZAC Take 40 mg by mouth daily.   ibuprofen 200 MG tablet Commonly known as:  ADVIL,MOTRIN Take 400 mg by mouth every 6 (six) hours as needed for moderate pain.   metoprolol succinate 25 MG 24 hr tablet Commonly known as:  TOPROL XL Take 1 tablet (25 mg total) by mouth daily.   metoprolol tartrate 50 MG tablet Commonly known as:  LOPRESSOR Take 1 tablet (50 mg total) by mouth once for 1 dose.   omeprazole 20 MG tablet Commonly known as:  PRILOSEC OTC Take 20 mg by mouth daily.   pseudoephedrine 120 MG 12 hr tablet Commonly known as:  SUDAFED Take 120 mg by mouth daily as needed for congestion.       Allergies:  Allergies  Allergen Reactions  . Bupropion Other (See Comments)    HEART PALPITATION, PAIN ATTACKS  . Buspar [Buspirone] Other (See Comments)    HEART PALPITATION, PAIN ATTACKS  . Cymbalta [Duloxetine Hcl] Other (See Comments)    HEART PALPITATION, PAIN ATTACKS  . Pristiq [Desvenlafaxine] Other (See Comments)    HEART PALPITATION, PAIN ATTACKS  . Zoloft [Sertraline] Other (See Comments)    HEART PALPITATION, PAIN ATTACKS    Family History: Family History  Problem Relation Age of  Onset  . Arthritis Mother   . Heart failure Father   . COPD Father   . Diabetes Father   . Heart attack Father 36  . ALS Paternal Aunt   . ALS Cousin   . ALS Cousin   . Breast cancer Maternal Grandmother   . Stroke Maternal Grandmother   . Heart attack Maternal Grandmother   . Suicidality Maternal Grandfather   . Stroke Paternal Grandmother   . Stroke Paternal Grandfather     Social History:  reports that she has quit smoking. Her smoking use included cigarettes. She smoked 0.50 packs per day. She has never used smokeless tobacco. She reports that she does not drink alcohol or use drugs.  ROS:                                        Physical Exam: There were no vitals taken for this  visit.  Constitutional:  Alert and oriented, No acute distress. HEENT: [place] AT, moist mucus membranes.  Trachea midline, no masses. Cardiovascular: No clubbing, cyanosis, or edema. Respiratory: Normal respiratory effort, no increased work of breathing. GI: Abdomen is soft, nontender, nondistended, no abdominal masses GU: No CVA tenderness Lymph: No cervical or inguinal lymphadenopathy. Skin: No rashes, bruises or suspicious lesions. Neurologic: Grossly intact, no focal deficits, moving all 4 extremities. Psychiatric: Normal mood and affect.  Laboratory Data: Lab Results  Component Value Date   WBC 10.1 12/09/2016   HGB 14.0 12/09/2016   HCT 42.6 12/09/2016   MCV 86.4 12/09/2016   PLT 236 12/09/2016    Lab Results  Component Value Date   CREATININE 0.70 04/11/2018    No results found for: PSA  No results found for: TESTOSTERONE  Lab Results  Component Value Date   HGBA1C 5.5 12/15/2016    Urinalysis    Component Value Date/Time   COLORURINE YELLOW 07/11/2016 1520   APPEARANCEUR CLEAR 07/11/2016 1520   LABSPEC 1.018 07/11/2016 1520   PHURINE 7.0 07/11/2016 1520   GLUCOSEU NEGATIVE 07/11/2016 1520   HGBUR NEGATIVE 07/11/2016 1520   BILIRUBINUR NEGATIVE 07/11/2016 1520   KETONESUR NEGATIVE 07/11/2016 1520   PROTEINUR NEGATIVE 07/11/2016 1520   UROBILINOGEN 1.0 03/04/2013 2045   NITRITE NEGATIVE 07/11/2016 1520   LEUKOCYTESUR NEGATIVE 07/11/2016 1520    Lab Results  Component Value Date   BACTERIA RARE 07/21/2009    Pertinent Imaging: *** No results found for this or any previous visit. No results found for this or any previous visit. No results found for this or any previous visit. No results found for this or any previous visit. No results found for this or any previous visit. No results found for this or any previous visit. No results found for this or any previous visit. No results found for this or any previous visit.  Assessment & Plan:    There  are no diagnoses linked to this encounter.  No follow-ups on file.  Hollice Espy, MD  [facility] [address] [place], [place] [address] [phone]

## 2018-11-06 ENCOUNTER — Emergency Department
Admission: EM | Admit: 2018-11-06 | Discharge: 2018-11-06 | Disposition: A | Discharge status: Home / Self Care | Payer: Medicaid Other | Attending: Emergency Medicine | Admitting: Emergency Medicine

## 2018-11-06 ENCOUNTER — Other Ambulatory Visit: Payer: Self-pay

## 2018-11-06 DIAGNOSIS — J45909 Unspecified asthma, uncomplicated: Secondary | ICD-10-CM | POA: Insufficient documentation

## 2018-11-06 DIAGNOSIS — J069 Acute upper respiratory infection, unspecified: Secondary | ICD-10-CM | POA: Insufficient documentation

## 2018-11-06 DIAGNOSIS — Z79899 Other long term (current) drug therapy: Secondary | ICD-10-CM | POA: Insufficient documentation

## 2018-11-06 DIAGNOSIS — Z87891 Personal history of nicotine dependence: Secondary | ICD-10-CM | POA: Insufficient documentation

## 2018-11-06 DIAGNOSIS — R7303 Prediabetes: Secondary | ICD-10-CM | POA: Insufficient documentation

## 2018-11-06 DIAGNOSIS — R490 Dysphonia: Secondary | ICD-10-CM | POA: Insufficient documentation

## 2018-11-06 LAB — GROUP A STREP BY PCR: GROUP A STREP BY PCR: NOT DETECTED

## 2018-11-06 NOTE — ED Provider Notes (Signed)
[facility] Emergency Department Provider Note   ____________________________________________    I have reviewed the triage vital signs and the nursing notes.   HISTORY  Chief Complaint Sore Throat     HPI Belinda [name] is a 38 y.o. female who presents with complaints of a sore throat and hoarse voice.  Patient reports recently treated for sinus infection 3 days ago, she reports now her voice is hoarse and she has a mild sore throat.  She is already on Augmentin and fluticasone nasal spray.  No fevers or chills.  No nausea or vomiting.  No cough or shortness of breath   Past Medical History:  Diagnosis Date  . Allergic rhinitis   . Asthma    better after quitting smoking  . Depression with anxiety   . Elevated blood pressure reading    occasional elevated BPs; no medication yet  . Fibrocystic breast changes   . GERD (gastroesophageal reflux disease)   . History of echocardiogram    Echo 6/19:  Mild conc LVH, EF 65-70, normal wall motion, normal diastolic function, mild TR, PASP upper limits of normal  . Hyperlipemia    managed with diet  . Muscle spasm of back   . Numbness and tingling of left lower extremity   . Obesity   . Panic disorder   . Prediabetes   . PVC's (premature ventricular contractions) 03/21/2007   has worn several monitors; 1st age 74  . Status post complete hysterectomy     Patient Active Problem List   Diagnosis Date Noted  . 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 Abnormal glucose 12/15/2016    Past Surgical History:  Procedure Laterality Date  . ABDOMINAL HYSTERECTOMY    . DILATION AND CURETTAGE OF UTERUS      Prior to Admission medications   Medication Sig Start Date End Date Taking? Authorizing Provider  ALPRAZolam Duanne Moron) 0.5 MG tablet Take 0.5 mg by mouth 3 (three) times daily as needed for anxiety.    [provider]  cyclobenzaprine (FLEXERIL) 5 MG tablet Take 5 mg by  mouth 3 (three) times daily as needed for muscle spasms.    [provider]  FLUoxetine (PROZAC) 40 MG capsule Take 40 mg by mouth daily.    [provider]  ibuprofen (ADVIL,MOTRIN) 200 MG tablet Take 400 mg by mouth every 6 (six) hours as needed for moderate pain.     [provider]  metoprolol succinate (TOPROL XL) 25 MG 24 hr tablet Take 1 tablet (25 mg total) by mouth daily. 04/14/18   Richardson Dopp T, PA-C  metoprolol tartrate (LOPRESSOR) 50 MG tablet Take 1 tablet (50 mg total) by mouth once for 1 dose. 04/11/18 04/11/18  Richardson Dopp T, PA-C  omeprazole (PRILOSEC OTC) 20 MG tablet Take 20 mg by mouth daily.    [provider]  pseudoephedrine (SUDAFED) 120 MG 12 hr tablet Take 120 mg by mouth daily as needed for congestion.    [provider]     Allergies Bupropion; Buspar [buspirone]; Cymbalta [duloxetine hcl]; Pristiq [desvenlafaxine]; and Zoloft [sertraline]  Family History  Problem Relation Age of Onset  . Arthritis Mother   . Heart failure Father   . COPD Father   . Diabetes Father   . Heart attack Father 22  . ALS Paternal Aunt   . ALS Cousin   . ALS Cousin   . Breast cancer Maternal Grandmother   . Stroke Maternal  Grandmother   . Heart attack Maternal Grandmother   . Suicidality Maternal Grandfather   . Stroke Paternal Grandmother   . Stroke Paternal Grandfather     Social History Social History   Tobacco Use  . Smoking status: Former Smoker    Packs/day: 0.50    Types: Cigarettes  . Smokeless tobacco: Never Used  . Tobacco comment: Quit 2014  Substance Use Topics  . Alcohol use: No  . Drug use: No    Review of Systems  Constitutional: No fever/chills  ENT: As above   Gastrointestinal: No nausea, no vomiting.    Musculoskeletal: Negative for myalgias     ____________________________________________   PHYSICAL EXAM:  VITAL SIGNS: ED Triage Vitals  Enc Vitals Group     BP 11/06/18 2033 (!)  173/90     Pulse Rate 11/06/18 2033 (!) 107     Resp 11/06/18 2033 17     Temp 11/06/18 2033 98.4 F (36.9 C)     Temp Source 11/06/18 2033 Oral     SpO2 11/06/18 2033 98 %     Weight 11/06/18 2034 122 kg (269 lb)     Height 11/06/18 2034 1.651 m (5\' 5" )     Head Circumference --      Peak Flow --      Pain Score 11/06/18 2034 3     Pain Loc --      Pain Edu? --      Excl. in [place]? --      Constitutional: Alert and oriented. No acute distress. Pleasant and interactive Eyes: Conjunctivae are normal.    Mouth/Throat: Mucous membranes are moist.  Pharynx with mild erythema Cardiovascular: Normal rate, regular rhythm.  Respiratory: Normal respiratory effort.  No retractions.    Skin:  Skin is warm, dry and intact. No rash noted.   ____________________________________________   LABS (all labs ordered are listed, but only abnormal results are displayed)  Labs Reviewed  GROUP A STREP BY PCR   ____________________________________________  EKG   ____________________________________________  RADIOLOGY None ____________________________________________   PROCEDURES  Procedure(s) performed: No  Procedures   Critical Care performed: No ____________________________________________   INITIAL IMPRESSION / ASSESSMENT AND PLAN / ED COURSE  Pertinent labs & imaging results that were available during my care of the patient were reviewed by me and considered in my medical decision making (see chart for details).  Patient overall well-appearing and in no acute distress symptoms was consistent with a viral upper respiratory/sinus infection.  Recommend continuing her current medications   ____________________________________________   FINAL CLINICAL IMPRESSION(S) / ED DIAGNOSES  Final diagnoses:  Viral upper respiratory tract infection      NEW MEDICATIONS STARTED DURING THIS VISIT:  Discharge Medication List as of 11/06/2018 10:30 PM       Note:  This  document was prepared using Dragon voice recognition software and may include unintentional dictation errors.   Lavonia Drafts, MD 11/06/18 2303

## 2018-11-06 NOTE — ED Triage Notes (Signed)
Pt arrives to ED via POV from home with c/o throat "congestion". Pt denies "sore throat", but states she feels like her throat is "congested". Pt sates she was seen by her PCP last Friday, d/x'd with a sinus infection and r/x'd antibiotics. Pt reports taking ABX as r/x'd without relief. Pt denies N/V/D or fever; no SHOB, CP, or abdominal pain. No strep test was performed.

## 2018-11-06 NOTE — ED Notes (Addendum)
Reference triage note. Pt states she finished antibiotics with no relief of symptoms. Pt in NAD at this time and ambulatory to room.

## 2018-11-27 ENCOUNTER — Other Ambulatory Visit: Payer: Self-pay

## 2018-11-27 ENCOUNTER — Emergency Department
Admission: EM | Admit: 2018-11-27 | Discharge: 2018-11-27 | Disposition: A | Discharge status: Home / Self Care | Payer: Self-pay | Attending: Emergency Medicine | Admitting: Emergency Medicine

## 2018-11-27 ENCOUNTER — Encounter: Payer: Self-pay | Admitting: Emergency Medicine

## 2018-11-27 DIAGNOSIS — N390 Urinary tract infection, site not specified: Secondary | ICD-10-CM | POA: Insufficient documentation

## 2018-11-27 DIAGNOSIS — Z87891 Personal history of nicotine dependence: Secondary | ICD-10-CM | POA: Insufficient documentation

## 2018-11-27 DIAGNOSIS — Z79899 Other long term (current) drug therapy: Secondary | ICD-10-CM | POA: Insufficient documentation

## 2018-11-27 DIAGNOSIS — J45909 Unspecified asthma, uncomplicated: Secondary | ICD-10-CM | POA: Insufficient documentation

## 2018-11-27 LAB — WET PREP, GENITAL
CLUE CELLS WET PREP: NONE SEEN
SPERM: NONE SEEN
Trich, Wet Prep: NONE SEEN
Yeast Wet Prep HPF POC: NONE SEEN

## 2018-11-27 LAB — URINALYSIS, COMPLETE (UACMP) WITH MICROSCOPIC: Specific Gravity, Urine: 1.021 (ref 1.005–1.030)

## 2018-11-27 LAB — CHLAMYDIA/NGC RT PCR (ARMC ONLY)
Chlamydia Tr: NOT DETECTED
N gonorrhoeae: NOT DETECTED

## 2018-11-27 MED ORDER — FLUCONAZOLE 150 MG PO TABS: ORAL_TABLET | ORAL | 1 refills | Status: DC

## 2018-11-27 MED ORDER — CIPROFLOXACIN HCL 250 MG PO TABS: 250.0000 mg | ORAL_TABLET | Freq: Two times a day (BID) | ORAL | 0 refills | Status: DC

## 2018-11-27 NOTE — ED Provider Notes (Signed)
[facility] Emergency Department Provider Note  ____________________________________________   None    (approximate)  I have reviewed the triage vital signs and the nursing notes.   HISTORY  Chief Complaint Dysuria and Vaginal Discharge    HPI Belinda [name] is a 38 y.o. female presents emergency department complaining of urinary frequency and vaginal itching.  She states that the burning started last Thursday.  The itching started 2 days ago.  She thinks that she is.  History of several UTIs.  She denies fever, chills, abdominal pain, nausea/vomiting.   She is married with one sexual partner.   Past Medical History:  Diagnosis Date  . Allergic rhinitis   . Asthma    better after quitting smoking  . Depression with anxiety   . Elevated blood pressure reading    occasional elevated BPs; no medication yet  . Fibrocystic breast changes   . GERD (gastroesophageal reflux disease)   . History of echocardiogram    Echo 6/19:  Mild conc LVH, EF 65-70, normal wall motion, normal diastolic function, mild TR, PASP upper limits of normal  . Hyperlipemia    managed with diet  . Muscle spasm of back   . Numbness and tingling of left lower extremity   . Obesity   . Panic disorder   . Prediabetes   . PVC's (premature ventricular contractions) 03/21/2007   has worn several monitors; 1st age 69  . Status post complete hysterectomy     Patient Active Problem List   Diagnosis Date Noted  . 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 Abnormal glucose 12/15/2016    Past Surgical History:  Procedure Laterality Date  . ABDOMINAL HYSTERECTOMY    . DILATION AND CURETTAGE OF UTERUS      Prior to Admission medications   Medication Sig Start Date End Date Taking? Authorizing Provider  ALPRAZolam Duanne Moron) 0.5 MG tablet Take 0.5 mg by mouth 3 (three) times daily as needed for anxiety.    [provider]  ciprofloxacin (CIPRO) 250 MG  tablet Take 1 tablet (250 mg total) by mouth 2 (two) times daily. 11/27/18   Dacota Devall, Linden Dolin, PA-C  cyclobenzaprine (FLEXERIL) 5 MG tablet Take 5 mg by mouth 3 (three) times daily as needed for muscle spasms.    [provider]  fluconazole (DIFLUCAN) 150 MG tablet Take one now and one in a week 11/27/18   Caryn Section, Linden Dolin, PA-C  FLUoxetine (PROZAC) 40 MG capsule Take 40 mg by mouth daily.    [provider]  ibuprofen (ADVIL,MOTRIN) 200 MG tablet Take 400 mg by mouth every 6 (six) hours as needed for moderate pain.     [provider]  metoprolol succinate (TOPROL XL) 25 MG 24 hr tablet Take 1 tablet (25 mg total) by mouth daily. 04/14/18   Richardson Dopp T, PA-C  metoprolol tartrate (LOPRESSOR) 50 MG tablet Take 1 tablet (50 mg total) by mouth once for 1 dose. 04/11/18 04/11/18  Richardson Dopp T, PA-C  omeprazole (PRILOSEC OTC) 20 MG tablet Take 20 mg by mouth daily.    [provider]  pseudoephedrine (SUDAFED) 120 MG 12 hr tablet Take 120 mg by mouth daily as needed for congestion.    [provider]    Allergies Bupropion; Buspar [buspirone]; Cymbalta [duloxetine hcl]; Pristiq [desvenlafaxine]; and Zoloft [sertraline]  Family History  Problem Relation Age of Onset  . Arthritis Mother   . Heart failure Father   . COPD  Father   . Diabetes Father   . Heart attack Father 79  . ALS Paternal Aunt   . ALS Cousin   . ALS Cousin   . Breast cancer Maternal Grandmother   . Stroke Maternal Grandmother   . Heart attack Maternal Grandmother   . Suicidality Maternal Grandfather   . Stroke Paternal Grandmother   . Stroke Paternal Grandfather     Social History Social History   Tobacco Use  . Smoking status: Former Smoker    Packs/day: 0.50    Types: Cigarettes  . Smokeless tobacco: Never Used  . Tobacco comment: Quit 2014  Substance Use Topics  . Alcohol use: No  . Drug use: No    Review of Systems  Constitutional: No fever/chills Eyes: No  visual changes. ENT: No sore throat. Respiratory: Denies cough Genitourinary: Positive for dysuria.  Positive for vaginal itching Musculoskeletal: Negative for back pain. Skin: Negative for rash.    ____________________________________________   PHYSICAL EXAM:  VITAL SIGNS: ED Triage Vitals  Enc Vitals Group     BP 11/27/18 1411 (!) 141/75     Pulse Rate 11/27/18 1411 95     Resp 11/27/18 1411 14     Temp 11/27/18 1411 98.4 F (36.9 C)     Temp Source 11/27/18 1411 Oral     SpO2 11/27/18 1411 98 %     Weight 11/27/18 1412 268 lb 15.4 oz (122 kg)     Height 11/27/18 1412 5\' 5"  (1.651 m)     Head Circumference --      Peak Flow --      Pain Score 11/27/18 1412 3     Pain Loc --      Pain Edu? --      Excl. in [place]? --     Constitutional: Alert and oriented. Well appearing and in no acute distress. Eyes: Conjunctivae are normal.  Head: Atraumatic. Nose: No congestion/rhinnorhea. Mouth/Throat: Mucous membranes are moist.   Neck:  supple no lymphadenopathy noted Cardiovascular: Normal rate, regular rhythm. Respiratory: Normal respiratory effort.  No retractions,  GU: No herpetic lesions noted on external vaginal exam.  There is a strong odor of urine.  Vaginal exam showed a thin white milky discharge.   Musculoskeletal: FROM all extremities, warm and well perfused Neurologic:  Normal speech and language.  Skin:  Skin is warm, dry and intact. No rash noted. Psychiatric: Mood and affect are normal. Speech and behavior are normal.  ____________________________________________   LABS (all labs ordered are listed, but only abnormal results are displayed)  Labs Reviewed  WET PREP, GENITAL - Abnormal; Notable for the following components:      Result Value   WBC, Wet Prep HPF POC FEW (*)    All other components within normal limits  URINALYSIS, COMPLETE (UACMP) WITH MICROSCOPIC - Abnormal; Notable for the following components:   Color, Urine ORANGE (*)    Glucose, UA    (*)    Value: TEST NOT REPORTED DUE TO COLOR INTERFERENCE OF URINE PIGMENT   Hgb urine dipstick   (*)    Value: TEST NOT REPORTED DUE TO COLOR INTERFERENCE OF URINE PIGMENT   Bilirubin Urine   (*)    Value: TEST NOT REPORTED DUE TO COLOR INTERFERENCE OF URINE PIGMENT   Ketones, ur   (*)    Value: TEST NOT REPORTED DUE TO COLOR INTERFERENCE OF URINE PIGMENT   Protein, ur   (*)    Value: TEST NOT REPORTED DUE TO COLOR  INTERFERENCE OF URINE PIGMENT   Nitrite   (*)    Value: TEST NOT REPORTED DUE TO COLOR INTERFERENCE OF URINE PIGMENT   Leukocytes, UA   (*)    Value: TEST NOT REPORTED DUE TO COLOR INTERFERENCE OF URINE PIGMENT   Bacteria, UA RARE (*)    All other components within normal limits  CHLAMYDIA/NGC RT PCR ([facility] ONLY)  URINE CULTURE   ____________________________________________   ____________________________________________  RADIOLOGY    ____________________________________________   PROCEDURES  Procedure(s) performed: No  Procedures    ____________________________________________   INITIAL IMPRESSION / ASSESSMENT AND PLAN / ED COURSE  Pertinent labs & imaging results that were available during my care of the patient were reviewed by me and considered in my medical decision making (see chart for details).   Patient is 38 year old female presents emergency department with UTI and vaginal itching  Physical exam shows a nontoxic afebrile female.  Vaginal exam shows a white milky discharge.  UA is indeterminate due to the AZO she had taken.  There are 11-20 WBCs and rare bacteria.  A urine culture will be added due to the Azo being present. Wet prep GC/chlamydia   Wet prep shows WBCs only.  GC/chlamydia is negative  Explained the results to the patient.  She was given a prescription for Cipro twice daily for 7 days, Diflucan 150 mg 1 now and 1 in 1 week.  1 refill if the symptoms continue.  She is to follow-up with her regular doctor if not better  in 3 to 5 days.  Return emergency department worsening.  She states she understands will comply.  She was discharged in stable condition.  As part of my medical decision making, I reviewed the following data within the [facility] notes reviewed and incorporated, Labs reviewed wet prep positive for WBCs, UA positive for leuks, GC/chlamydia is negative., Old chart reviewed, Notes from prior ED visits and [place] Controlled Substance Database  ____________________________________________   FINAL CLINICAL IMPRESSION(S) / ED DIAGNOSES  Final diagnoses:  Urinary tract infection without hematuria, site unspecified      NEW MEDICATIONS STARTED DURING THIS VISIT:  Discharge Medication List as of 11/27/2018  4:26 PM    START taking these medications   Details  ciprofloxacin (CIPRO) 250 MG tablet Take 1 tablet (250 mg total) by mouth 2 (two) times daily., Starting Mon 11/27/2018, Normal    fluconazole (DIFLUCAN) 150 MG tablet Take one now and one in a week, Normal         Note:  This document was prepared using Dragon voice recognition software and may include unintentional dictation errors.    Versie Starks, PA-C 11/27/18 2115    Rudene Re, MD 11/28/18 [phone]

## 2018-11-27 NOTE — ED Triage Notes (Signed)
Says she has symptoms of uti--dysurea. Is taking azo since Saturday.  No vomiting.  She is also having a yeast infection.  Says her pcp is unablet ot see her til later this week.

## 2018-11-27 NOTE — ED Notes (Signed)
See triage note  States she developed urinary freq and pain  sxs' started about 3 days ago

## 2018-11-27 NOTE — Discharge Instructions (Addendum)
Follow-up with your regular doctor if not better in 3 days.  Return emergency department worsening.  Drink plenty of water to flush your system.

## 2018-11-29 LAB — URINE CULTURE: Culture: 50000 — AB

## 2019-11-13 DIAGNOSIS — Z6841 Body Mass Index (BMI) 40.0 and over, adult: Secondary | ICD-10-CM | POA: Diagnosis not present

## 2019-11-13 DIAGNOSIS — G8929 Other chronic pain: Secondary | ICD-10-CM | POA: Diagnosis not present

## 2019-11-13 DIAGNOSIS — M25521 Pain in right elbow: Secondary | ICD-10-CM | POA: Diagnosis not present

## 2019-11-21 DIAGNOSIS — M7710 Lateral epicondylitis, unspecified elbow: Secondary | ICD-10-CM | POA: Insufficient documentation

## 2019-11-21 DIAGNOSIS — M7711 Lateral epicondylitis, right elbow: Secondary | ICD-10-CM | POA: Diagnosis not present

## 2019-11-21 DIAGNOSIS — S56911A Strain of unspecified muscles, fascia and tendons at forearm level, right arm, initial encounter: Secondary | ICD-10-CM | POA: Diagnosis not present

## 2019-12-10 DIAGNOSIS — F331 Major depressive disorder, recurrent, moderate: Secondary | ICD-10-CM | POA: Diagnosis not present

## 2019-12-10 DIAGNOSIS — Z6841 Body Mass Index (BMI) 40.0 and over, adult: Secondary | ICD-10-CM | POA: Diagnosis not present

## 2019-12-25 DIAGNOSIS — B372 Candidiasis of skin and nail: Secondary | ICD-10-CM | POA: Diagnosis not present

## 2019-12-25 DIAGNOSIS — Z6841 Body Mass Index (BMI) 40.0 and over, adult: Secondary | ICD-10-CM | POA: Diagnosis not present

## 2020-01-07 DIAGNOSIS — U071 COVID-19: Secondary | ICD-10-CM | POA: Diagnosis not present

## 2020-01-07 DIAGNOSIS — Z20828 Contact with and (suspected) exposure to other viral communicable diseases: Secondary | ICD-10-CM | POA: Diagnosis not present

## 2020-01-19 DIAGNOSIS — Z20828 Contact with and (suspected) exposure to other viral communicable diseases: Secondary | ICD-10-CM | POA: Diagnosis not present

## 2020-01-19 DIAGNOSIS — Z03818 Encounter for observation for suspected exposure to other biological agents ruled out: Secondary | ICD-10-CM | POA: Diagnosis not present

## 2020-01-28 DIAGNOSIS — B948 Sequelae of other specified infectious and parasitic diseases: Secondary | ICD-10-CM | POA: Diagnosis not present

## 2020-01-28 DIAGNOSIS — J45901 Unspecified asthma with (acute) exacerbation: Secondary | ICD-10-CM | POA: Diagnosis not present

## 2020-01-28 DIAGNOSIS — Z6841 Body Mass Index (BMI) 40.0 and over, adult: Secondary | ICD-10-CM | POA: Diagnosis not present

## 2020-01-28 DIAGNOSIS — R300 Dysuria: Secondary | ICD-10-CM | POA: Diagnosis not present

## 2020-02-18 DIAGNOSIS — N390 Urinary tract infection, site not specified: Secondary | ICD-10-CM | POA: Diagnosis not present

## 2020-02-18 DIAGNOSIS — B373 Candidiasis of vulva and vagina: Secondary | ICD-10-CM | POA: Diagnosis not present

## 2020-02-18 DIAGNOSIS — R300 Dysuria: Secondary | ICD-10-CM | POA: Diagnosis not present

## 2020-02-18 DIAGNOSIS — I10 Essential (primary) hypertension: Secondary | ICD-10-CM | POA: Diagnosis not present

## 2020-02-26 NOTE — Progress Notes (Signed)
02/27/20 4:40 PM   Belinda [name] [birth year] [id number]  Referring provider: Leonides Sake, MD [place],  [place] [address] Chief Complaint  Patient presents with  . Urinary Incontinence    HPI: Belinda [name] is a 39 y.o. F who presents today for the evaluation and management of urinary incontinence.   Visited PCP on 02/18/20 c/o of UTI w/ associated symptoms of frequency, urgency and flank pain/suprapubic pain. She also reported of urinary leakage despite having sling procedure in 2009 done by gynecologist. UA during visit revealed >20 WBC, few RBC, and few bacteria w/ associated urinate culture negative. Treated and stopped Cipro once urine culture results turned out negative.   She reports of several infections over the years however the two most recent infections were the worse w/ one being 1 month and another being 2 weeks ago. She reports of symptoms q day w/ some days being worse. She has been leaking for many years managed w/ wearing pads which worsens w/ physical exacerbation. She has had 2 kids vaginally.   She reports of a sharp sudden RLQ pain which she initially attributed to appendicitis onset after Covid-19 2 weeks ago. She has been taking drinking alcohol for relief since she does not want to take Azo. She also witnessed symptoms of burning w/ urination and pain.   Today, she reports of suprapubic discomfort otherwise no burning w/ urination. She also states of being constipated.   No prior hx of kidney stones. No recent upper tract imaging.   SHx of partial hysterectomy w/ ovaries intact.   PMH: Past Medical History:  Diagnosis Date  . Allergic rhinitis   . Asthma    better after quitting smoking  . Depression with anxiety   . Elevated blood pressure reading    occasional elevated BPs; no medication yet  . Fibrocystic breast changes   . GERD (gastroesophageal reflux disease)   . History of echocardiogram    Echo 6/19:  Mild conc LVH, EF 65-70,  normal wall motion, normal diastolic function, mild TR, PASP upper limits of normal  . Hyperlipemia    managed with diet  . Muscle spasm of back   . Numbness and tingling of left lower extremity   . Obesity   . Panic disorder   . Prediabetes   . PVC's (premature ventricular contractions) 03/21/2007   has worn several monitors; 1st age 58  . Status post complete hysterectomy     Surgical History: Past Surgical History:  Procedure Laterality Date  . ABDOMINAL HYSTERECTOMY    . DILATION AND CURETTAGE OF UTERUS      Home Medications:  Allergies as of 02/27/2020      Reactions   Bupropion Other (See Comments)   HEART PALPITATION, PAIN ATTACKS   Buspar [buspirone] Other (See Comments)   HEART PALPITATION, PAIN ATTACKS   Cymbalta [duloxetine Hcl] Other (See Comments)   HEART PALPITATION, PAIN ATTACKS   Pristiq [desvenlafaxine] Other (See Comments)   HEART PALPITATION, PAIN ATTACKS   Zoloft [sertraline] Other (See Comments)   HEART PALPITATION, PAIN ATTACKS      Medication List       Accurate as of February 27, 2020 11:59 PM. If you have any questions, ask your nurse or doctor.        STOP taking these medications   ciprofloxacin 250 MG tablet Commonly known as: CIPRO Stopped by: Hollice Espy, MD   cyclobenzaprine 5 MG tablet Commonly known as: FLEXERIL Stopped by: Caryl Pina  Erlene Quan, MD   metoprolol tartrate 50 MG tablet Commonly known as: LOPRESSOR Stopped by: Hollice Espy, MD     TAKE these medications   ALPRAZolam 0.5 MG tablet Commonly known as: XANAX Take 0.5 mg by mouth 3 (three) times daily as needed for anxiety.   fluconazole 150 MG tablet Commonly known as: DIFLUCAN Take one now and one in a week   FLUoxetine 40 MG capsule Commonly known as: PROZAC Take 40 mg by mouth daily. What changed: Another medication with the same name was removed. Continue taking this medication, and follow the directions you see here. Changed by: Hollice Espy, MD     FLUoxetine 20 MG capsule Commonly known as: PROZAC Take 60 mg by mouth daily. What changed: Another medication with the same name was removed. Continue taking this medication, and follow the directions you see here. Changed by: Hollice Espy, MD   ibuprofen 200 MG tablet Commonly known as: ADVIL Take 400 mg by mouth every 6 (six) hours as needed for moderate pain.   lisinopril 5 MG tablet Commonly known as: ZESTRIL Take 5 mg by mouth daily.   metoprolol succinate 25 MG 24 hr tablet Commonly known as: Toprol XL Take 1 tablet (25 mg total) by mouth daily.   omeprazole 20 MG tablet Commonly known as: PRILOSEC OTC Take 20 mg by mouth daily.   PRILOSEC PO Prilosec OTC   ProAir HFA 108 (90 Base) MCG/ACT inhaler Generic drug: albuterol ProAir HFA 90 mcg/actuation aerosol inhaler  INHALE 1 TO 2 EVERY 4 TO 6 HOURS AS NEEDED WHEEZING   pseudoephedrine 120 MG 12 hr tablet Commonly known as: SUDAFED Take 120 mg by mouth daily as needed for congestion.       Allergies:  Allergies  Allergen Reactions  . Bupropion Other (See Comments)    HEART PALPITATION, PAIN ATTACKS  . Buspar [Buspirone] Other (See Comments)    HEART PALPITATION, PAIN ATTACKS  . Cymbalta [Duloxetine Hcl] Other (See Comments)    HEART PALPITATION, PAIN ATTACKS  . Pristiq [Desvenlafaxine] Other (See Comments)    HEART PALPITATION, PAIN ATTACKS  . Zoloft [Sertraline] Other (See Comments)    HEART PALPITATION, PAIN ATTACKS    Family History: Family History  Problem Relation Age of Onset  . Arthritis Mother   . Heart failure Father   . COPD Father   . Diabetes Father   . Heart attack Father 66  . ALS Paternal Aunt   . ALS Cousin   . ALS Cousin   . Breast cancer Maternal Grandmother   . Stroke Maternal Grandmother   . Heart attack Maternal Grandmother   . Suicidality Maternal Grandfather   . Stroke Paternal Grandmother   . Stroke Paternal Grandfather     Social History:  reports that she has  quit smoking. Her smoking use included cigarettes. She smoked 0.50 packs per day. She has never used smokeless tobacco. She reports that she does not drink alcohol or use drugs.   Physical Exam: BP 138/83   Pulse 88   Ht 5\' 5"  (1.651 m)   Wt 295 lb (133.8 kg)   BMI 49.09 kg/m   Constitutional:  Alert and oriented, No acute distress. HEENT: [place] AT, moist mucus membranes.  Trachea midline, no masses. Cardiovascular: No clubbing, cyanosis, or edema. Respiratory: Normal respiratory effort, no increased work of breathing. Skin: No rashes, bruises or suspicious lesions. Neurologic: Grossly intact, no focal deficits, moving all 4 extremities. Psychiatric: Normal mood and affect.  Laboratory Data:  Urinalysis  UA negative.   Pertinent Imaging: Results for orders placed or performed in visit on 02/27/20  Microscopic Examination   URINE  Result Value Ref Range   WBC, UA 0-5 0 - 5 /hpf   RBC None seen 0 - 2 /hpf   Epithelial Cells (non renal) 0-10 0 - 10 /hpf   Bacteria, UA Moderate (A) None seen/Few  Urinalysis, Complete  Result Value Ref Range   Specific Gravity, UA 1.020 1.005 - 1.030   pH, UA 7.0 5.0 - 7.5   Color, UA Yellow Yellow   Appearance Ur Cloudy (A) Clear   Leukocytes,UA Negative Negative   Protein,UA Negative Negative/Trace   Glucose, UA Negative Negative   Ketones, UA Negative Negative   RBC, UA Negative Negative   Bilirubin, UA Negative Negative   Urobilinogen, Ur 0.2 0.2 - 1.0 mg/dL   Nitrite, UA Negative Negative   Microscopic Examination See below:   BLADDER SCAN AMB NON-IMAGING  Result Value Ref Range   Scan Result 40 ML    Assessment & Plan:    1. Stress incontinence Adequate emptying of bladder  Recommended Kegel exercises and weight loss  Recommended f/u w/ Dr. Matilde Sprang if behavioral modification ineffective   2. RLQ pain Suspect related to possible stone passage KUB today  Return if symptoms worsen   3. Constipation  Recommended stool  softener   Return in about 3 months (around 05/28/2020).  [facility] [address] [place], [place] [address] [phone]  I, Lucas Mallow, am acting as a scribe for Dr. Hollice Espy,  I have reviewed the above documentation for accuracy and completeness, and I agree with the above.   Hollice Espy, MD

## 2020-02-27 ENCOUNTER — Ambulatory Visit: Payer: BC Managed Care – PPO | Admitting: Urology

## 2020-02-27 ENCOUNTER — Ambulatory Visit
Admission: RE | Admit: 2020-02-27 | Discharge: 2020-02-27 | Disposition: A | Discharge status: Home / Self Care | Payer: BC Managed Care – PPO | Source: Ambulatory Visit | Attending: Urology | Admitting: Urology

## 2020-02-27 ENCOUNTER — Other Ambulatory Visit: Payer: Self-pay

## 2020-02-27 VITALS — BP 138/83 | HR 88 | Ht 65.0 in | Wt 295.0 lb

## 2020-02-27 DIAGNOSIS — R32 Unspecified urinary incontinence: Secondary | ICD-10-CM

## 2020-02-27 DIAGNOSIS — K5904 Chronic idiopathic constipation: Secondary | ICD-10-CM

## 2020-02-27 DIAGNOSIS — R109 Unspecified abdominal pain: Secondary | ICD-10-CM | POA: Diagnosis not present

## 2020-02-27 DIAGNOSIS — N393 Stress incontinence (female) (male): Secondary | ICD-10-CM | POA: Diagnosis not present

## 2020-02-27 DIAGNOSIS — R1031 Right lower quadrant pain: Secondary | ICD-10-CM | POA: Diagnosis not present

## 2020-02-27 LAB — BLADDER SCAN AMB NON-IMAGING: Scan Result: 40

## 2020-02-28 LAB — URINALYSIS, COMPLETE
Bilirubin, UA: NEGATIVE
Glucose, UA: NEGATIVE
Ketones, UA: NEGATIVE
Leukocytes,UA: NEGATIVE
Nitrite, UA: NEGATIVE
Protein,UA: NEGATIVE
RBC, UA: NEGATIVE
Specific Gravity, UA: 1.02 (ref 1.005–1.030)
Urobilinogen, Ur: 0.2 mg/dL (ref 0.2–1.0)
pH, UA: 7 (ref 5.0–7.5)

## 2020-02-28 LAB — MICROSCOPIC EXAMINATION: RBC, Urine: NONE SEEN /hpf (ref 0–2)

## 2020-02-29 ENCOUNTER — Telehealth: Payer: Self-pay | Admitting: *Deleted

## 2020-02-29 NOTE — Telephone Encounter (Addendum)
Patient informed, voiced understanding.   ----- Message from Hollice Espy, MD sent at 02/29/2020  3:28 PM EDT ----- No stones on KUB.  Hollice Espy, MD

## 2020-03-04 DIAGNOSIS — Z6841 Body Mass Index (BMI) 40.0 and over, adult: Secondary | ICD-10-CM | POA: Diagnosis not present

## 2020-03-04 DIAGNOSIS — I10 Essential (primary) hypertension: Secondary | ICD-10-CM | POA: Diagnosis not present

## 2020-03-14 DIAGNOSIS — N611 Abscess of the breast and nipple: Secondary | ICD-10-CM | POA: Diagnosis not present

## 2020-03-14 DIAGNOSIS — Z6841 Body Mass Index (BMI) 40.0 and over, adult: Secondary | ICD-10-CM | POA: Diagnosis not present

## 2020-03-18 DIAGNOSIS — K649 Unspecified hemorrhoids: Secondary | ICD-10-CM | POA: Insufficient documentation

## 2020-03-23 ENCOUNTER — Encounter: Payer: Self-pay | Admitting: Urology

## 2020-03-25 ENCOUNTER — Encounter: Payer: Self-pay | Admitting: Physician Assistant

## 2020-03-25 ENCOUNTER — Telehealth: Payer: Self-pay

## 2020-03-25 ENCOUNTER — Other Ambulatory Visit: Payer: Self-pay

## 2020-03-25 ENCOUNTER — Ambulatory Visit: Payer: BC Managed Care – PPO | Admitting: Physician Assistant

## 2020-03-25 VITALS — BP 128/80 | HR 90 | Ht 65.0 in | Wt 292.0 lb

## 2020-03-25 DIAGNOSIS — R300 Dysuria: Secondary | ICD-10-CM

## 2020-03-25 DIAGNOSIS — M6289 Other specified disorders of muscle: Secondary | ICD-10-CM

## 2020-03-25 NOTE — Progress Notes (Signed)
03/25/2020 2:49 PM   Belinda [name] 1981-07-31 [id number]  CC: Chief Complaint  Patient presents with  . Dysuria    HPI: Belinda [name] is a 39 y.o. female with PMH stress incontinence and constipation who presents today for evaluation of continued lower abdominal pain. She is an established [facility] patient who last saw Dr. Erlene Quan on 02/27/2020 for evaluation of her urinary incontinence.  KUB reassuring for acute stone episode at that time.  Today, patient reports continued intermittent lower abdominal pain and cramping.  She reports a sensation of tight muscles in her lower abdomen.  She is also experiencing lower abdominal burning at rest that is not relieved with urination.  Additionally, she is experiencing dyspareunia notable for a deep sensation of cramping that is relieved with orgasm.  She is concerned about possible pelvic floor dysfunction.  In-office UA today pan negative; urine microscopy with 6-10 WBCs/HPF and >10 epithelial cells.  PMH: Past Medical History:  Diagnosis Date  . Allergic rhinitis   . Asthma    better after quitting smoking  . Depression with anxiety   . Elevated blood pressure reading    occasional elevated BPs; no medication yet  . Fibrocystic breast changes   . GERD (gastroesophageal reflux disease)   . History of echocardiogram    Echo 6/19:  Mild conc LVH, EF 65-70, normal wall motion, normal diastolic function, mild TR, PASP upper limits of normal  . Hyperlipemia    managed with diet  . Muscle spasm of back   . Numbness and tingling of left lower extremity   . Obesity   . Panic disorder   . Prediabetes   . PVC's (premature ventricular contractions) 03/21/2007   has worn several monitors; 1st age 44  . Status post complete hysterectomy     Surgical History: Past Surgical History:  Procedure Laterality Date  . ABDOMINAL HYSTERECTOMY    . DILATION AND CURETTAGE OF UTERUS      Home Medications:  Allergies as of 03/25/2020      Reactions   Bupropion Other (See Comments)   HEART PALPITATION, PAIN ATTACKS   Buspar [buspirone] Other (See Comments)   HEART PALPITATION, PAIN ATTACKS   Cymbalta [duloxetine Hcl] Other (See Comments)   HEART PALPITATION, PAIN ATTACKS   Pristiq [desvenlafaxine] Other (See Comments)   HEART PALPITATION, PAIN ATTACKS   Zoloft [sertraline] Other (See Comments)   HEART PALPITATION, PAIN ATTACKS      Medication List       Accurate as of Mar 25, 2020  2:49 PM. If you have any questions, ask your nurse or doctor.        STOP taking these medications   fluconazole 150 MG tablet Commonly known as: DIFLUCAN Stopped by: Debroah Loop, PA-C   lisinopril 5 MG tablet Commonly known as: ZESTRIL Stopped by: Debroah Loop, PA-C   omeprazole 20 MG tablet Commonly known as: PRILOSEC OTC Stopped by: Debroah Loop, PA-C   pseudoephedrine 120 MG 12 hr tablet Commonly known as: SUDAFED Stopped by: Debroah Loop, PA-C     TAKE these medications   ALPRAZolam 0.5 MG tablet Commonly known as: XANAX Take 0.5 mg by mouth 3 (three) times daily as needed for anxiety.   FLUoxetine 20 MG capsule Commonly known as: PROZAC Take 60 mg by mouth daily. What changed: Another medication with the same name was removed. Continue taking this medication, and follow the directions you see here. Changed by: Debroah Loop, PA-C   ibuprofen 200 MG tablet  Commonly known as: ADVIL Take 400 mg by mouth every 6 (six) hours as needed for moderate pain.   metoprolol succinate 25 MG 24 hr tablet Commonly known as: Toprol XL Take 1 tablet (25 mg total) by mouth daily.   PRILOSEC PO Prilosec OTC   ProAir HFA 108 (90 Base) MCG/ACT inhaler Generic drug: albuterol ProAir HFA 90 mcg/actuation aerosol inhaler  INHALE 1 TO 2 EVERY 4 TO 6 HOURS AS NEEDED WHEEZING       Allergies:  Allergies  Allergen Reactions  . Bupropion Other (See Comments)    HEART PALPITATION, PAIN ATTACKS    . Buspar [Buspirone] Other (See Comments)    HEART PALPITATION, PAIN ATTACKS  . Cymbalta [Duloxetine Hcl] Other (See Comments)    HEART PALPITATION, PAIN ATTACKS  . Pristiq [Desvenlafaxine] Other (See Comments)    HEART PALPITATION, PAIN ATTACKS  . Zoloft [Sertraline] Other (See Comments)    HEART PALPITATION, PAIN ATTACKS    Family History: Family History  Problem Relation Age of Onset  . Arthritis Mother   . Heart failure Father   . COPD Father   . Diabetes Father   . Heart attack Father 3  . ALS Paternal Aunt   . ALS Cousin   . ALS Cousin   . Breast cancer Maternal Grandmother   . Stroke Maternal Grandmother   . Heart attack Maternal Grandmother   . Suicidality Maternal Grandfather   . Stroke Paternal Grandmother   . Stroke Paternal Grandfather     Social History:   reports that she has quit smoking. Her smoking use included cigarettes. She smoked 0.50 packs per day. She has never used smokeless tobacco. She reports that she does not drink alcohol or use drugs.  Physical Exam: BP 128/80   Pulse 90   Ht 5\' 5"  (1.651 m)   Wt 292 lb (132.5 kg)   BMI 48.59 kg/m   Constitutional:  Alert and oriented, no acute distress, nontoxic appearing HEENT: [place], AT Cardiovascular: No clubbing, cyanosis, or edema Respiratory: Normal respiratory effort, no increased work of breathing Skin: No rashes, bruises or suspicious lesions Neurologic: Grossly intact, no focal deficits, moving all 4 extremities Psychiatric: Normal mood and affect  Laboratory Data: Results for orders placed or performed in visit on 03/25/20  CULTURE, URINE COMPREHENSIVE   Specimen: Urine   UR  Result Value Ref Range   Urine Culture, Comprehensive Preliminary report    Organism ID, Bacteria Comment    Organism ID, Bacteria Comment   Microscopic Examination   URINE  Result Value Ref Range   WBC, UA 6-10 (A) 0 - 5 /hpf   RBC None seen 0 - 2 /hpf   Epithelial Cells (non renal) >10 (A) 0 - 10 /hpf    Bacteria, UA Few None seen/Few  Urinalysis, Complete  Result Value Ref Range   Specific Gravity, UA >1.030 (H) 1.005 - 1.030   pH, UA 6.0 5.0 - 7.5   Color, UA Yellow Yellow   Appearance Ur Cloudy (A) Clear   Leukocytes,UA Negative Negative   Protein,UA Negative Negative/Trace   Glucose, UA Negative Negative   Ketones, UA Negative Negative   RBC, UA Negative Negative   Bilirubin, UA Negative Negative   Urobilinogen, Ur 0.2 0.2 - 1.0 mg/dL   Nitrite, UA Negative Negative   Microscopic Examination See below:    Assessment & Plan:   1. Dysuria UA today contaminated yet reassuring for infection, will send for culture. - Urinalysis, Complete - CULTURE, URINE COMPREHENSIVE  2. Pelvic floor dysfunction in female Recommend pelvic floor PT in this patient with a history of stress incontinence, lower abdominal cramping, and dyspareunia.  May also be contributing to #1 above. - Ambulatory referral to Physical Therapy   Return in about 3 months (around 06/25/2020) for Symptom recheck s/p pelvic PT.  Debroah Loop, PA-C  [facility] [address] [place], [place] [address] [phone]

## 2020-03-25 NOTE — Telephone Encounter (Signed)
Called patient per Mychart request. She is still having dysuria as well as pelvic pain. She was added on to PA schedule today for further evaluation

## 2020-03-26 LAB — MICROSCOPIC EXAMINATION
Epithelial Cells (non renal): >10 /hpf — AB (ref 0–10)
RBC, Urine: NONE SEEN /hpf (ref 0–2)

## 2020-03-26 LAB — URINALYSIS, COMPLETE
Bilirubin, UA: NEGATIVE
Glucose, UA: NEGATIVE
Ketones, UA: NEGATIVE
Leukocytes,UA: NEGATIVE
Nitrite, UA: NEGATIVE
Protein,UA: NEGATIVE
RBC, UA: NEGATIVE
Specific Gravity, UA: >1.03 — ABNORMAL HIGH (ref 1.005–1.030)
Urobilinogen, Ur: 0.2 mg/dL (ref 0.2–1.0)
pH, UA: 6 (ref 5.0–7.5)

## 2020-03-29 LAB — CULTURE, URINE COMPREHENSIVE

## 2020-04-01 ENCOUNTER — Ambulatory Visit: Payer: No Typology Code available for payment source | Attending: Physician Assistant | Admitting: Physical Therapy

## 2020-04-02 ENCOUNTER — Telehealth: Payer: Self-pay | Admitting: Physician Assistant

## 2020-04-02 NOTE — Telephone Encounter (Signed)
Please contact the patient and ask if she is having any dysuria, urgency, or frequency.  Her urine culture grew bacteria, however her UA appeared contaminated and this may simply reflect skin flora.  If she is not symptomatic, there is no indication to treat at this time.

## 2020-04-02 NOTE — Telephone Encounter (Signed)
Contacted patient as advised. Patient denies dysuria, urgency, frequency or any other urinary symptoms. Patient agrees there is no indication to treat at this time.

## 2020-04-05 ENCOUNTER — Emergency Department
Admission: EM | Admit: 2020-04-05 | Discharge: 2020-04-05 | Disposition: A | Discharge status: Home / Self Care | Payer: BC Managed Care – PPO | Attending: Emergency Medicine | Admitting: Emergency Medicine

## 2020-04-05 ENCOUNTER — Encounter: Payer: Self-pay | Admitting: Emergency Medicine

## 2020-04-05 ENCOUNTER — Emergency Department: Payer: BC Managed Care – PPO

## 2020-04-05 ENCOUNTER — Other Ambulatory Visit: Payer: Self-pay

## 2020-04-05 DIAGNOSIS — M79605 Pain in left leg: Secondary | ICD-10-CM | POA: Diagnosis not present

## 2020-04-05 DIAGNOSIS — S39012A Strain of muscle, fascia and tendon of lower back, initial encounter: Secondary | ICD-10-CM | POA: Insufficient documentation

## 2020-04-05 DIAGNOSIS — Y9389 Activity, other specified: Secondary | ICD-10-CM | POA: Insufficient documentation

## 2020-04-05 DIAGNOSIS — J45909 Unspecified asthma, uncomplicated: Secondary | ICD-10-CM | POA: Insufficient documentation

## 2020-04-05 DIAGNOSIS — Y998 Other external cause status: Secondary | ICD-10-CM | POA: Diagnosis not present

## 2020-04-05 DIAGNOSIS — Y92410 Unspecified street and highway as the place of occurrence of the external cause: Secondary | ICD-10-CM | POA: Diagnosis not present

## 2020-04-05 DIAGNOSIS — Z87891 Personal history of nicotine dependence: Secondary | ICD-10-CM | POA: Insufficient documentation

## 2020-04-05 DIAGNOSIS — Z7982 Long term (current) use of aspirin: Secondary | ICD-10-CM | POA: Insufficient documentation

## 2020-04-05 DIAGNOSIS — S3992XA Unspecified injury of lower back, initial encounter: Secondary | ICD-10-CM | POA: Diagnosis not present

## 2020-04-05 DIAGNOSIS — Z79899 Other long term (current) drug therapy: Secondary | ICD-10-CM | POA: Insufficient documentation

## 2020-04-05 DIAGNOSIS — M545 Low back pain: Secondary | ICD-10-CM | POA: Diagnosis not present

## 2020-04-05 MED ORDER — HYDROCODONE-ACETAMINOPHEN 5-325 MG PO TABS: 1.0000 | ORAL_TABLET | Freq: Three times a day (TID) | ORAL | 0 refills | Status: AC | PRN

## 2020-04-05 MED ORDER — KETOROLAC TROMETHAMINE 10 MG PO TABS: 10.0000 mg | ORAL_TABLET | Freq: Three times a day (TID) | ORAL | 0 refills | Status: DC

## 2020-04-05 MED ORDER — LIDOCAINE 5 % EX PTCH: 1.0000 | MEDICATED_PATCH | CUTANEOUS | 0 refills | Status: AC

## 2020-04-05 MED ORDER — KETOROLAC TROMETHAMINE 30 MG/ML IJ SOLN
30.0000 mg | Freq: Once | INTRAMUSCULAR | Status: AC
  Administered 2020-04-05: 30 mg via INTRAMUSCULAR
  Filled 2020-04-05: qty 1

## 2020-04-05 MED ORDER — CYCLOBENZAPRINE HCL 5 MG PO TABS
5.0000 mg | ORAL_TABLET | Freq: Three times a day (TID) | ORAL | 0 refills | Status: DC | PRN
Start: 2020-04-05 — End: 2021-12-07
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
## 2020-04-15 ENCOUNTER — Encounter: Payer: BC Managed Care – PPO | Admitting: Physical Therapy

## 2020-04-21 DIAGNOSIS — R197 Diarrhea, unspecified: Secondary | ICD-10-CM | POA: Diagnosis not present

## 2020-04-21 DIAGNOSIS — K649 Unspecified hemorrhoids: Secondary | ICD-10-CM | POA: Diagnosis not present

## 2020-04-21 DIAGNOSIS — K529 Noninfective gastroenteritis and colitis, unspecified: Secondary | ICD-10-CM | POA: Diagnosis not present

## 2020-04-21 DIAGNOSIS — K921 Melena: Secondary | ICD-10-CM | POA: Diagnosis not present

## 2020-04-24 DIAGNOSIS — K529 Noninfective gastroenteritis and colitis, unspecified: Secondary | ICD-10-CM | POA: Diagnosis not present

## 2020-04-29 ENCOUNTER — Encounter: Payer: BC Managed Care – PPO | Admitting: Physical Therapy

## 2020-05-20 ENCOUNTER — Other Ambulatory Visit: Payer: Self-pay

## 2020-05-20 ENCOUNTER — Encounter: Payer: Self-pay | Admitting: Gastroenterology

## 2020-05-20 ENCOUNTER — Ambulatory Visit: Payer: BC Managed Care – PPO | Admitting: Gastroenterology

## 2020-05-20 VITALS — BP 120/72 | HR 77 | Temp 98.1°F | Ht 65.0 in | Wt 295.0 lb

## 2020-05-20 DIAGNOSIS — Z8371 Family history of colonic polyps: Secondary | ICD-10-CM

## 2020-05-20 DIAGNOSIS — R194 Change in bowel habit: Secondary | ICD-10-CM | POA: Diagnosis not present

## 2020-05-20 MED ORDER — NA SULFATE-K SULFATE-MG SULF 17.5-3.13-1.6 GM/177ML PO SOLN: ORAL | 0 refills | Status: DC

## 2020-05-20 NOTE — Progress Notes (Signed)
Belinda [name] [address]  [place]  [place], [place] [address]  Main: [phone]  Fax: [phone]   Gastroenterology Consultation  Referring Provider:     Philmore Pali, NP Primary Care Physician:  Practice, [facility] Reason for Consultation:    Colitis        HPI:    Chief Complaint  Patient presents with  . New Patient (Initial Visit)  . Ulcerative Colitis    Belinda [name] is a 39 y.o. y/o female referred for consultation & management  by Dr. Donavan Foil, [facility].  Patient reports history of hemorrhoids and as of the last 1 to 2 months, has had increased bleeding that she attributes to hemorrhoids.  However, states that she does not feel any external hemorrhoids.  She has previously been given rectal suppositories but she states she is not able to reach into her rectum comfortably to insert the suppository.  She also describes having 2-3 bowel movements a day that vary in consistency from small to loose bowel movements.  Prior to this, she is to have 1 formed bowel movement daily.  No prior EGD or colonoscopy.  Reports epigastric pain that occurs when she is constipated.  No weight loss.  Also reports that father had multiple colonoscopies with multiple polyps removed, and some of them were large.  She also had mildly elevated CRP in 2018  Past Medical History:  Diagnosis Date  . Allergic rhinitis   . Asthma    better after quitting smoking  . Depression with anxiety   . Elevated blood pressure reading    occasional elevated BPs; no medication yet  . Fibrocystic breast changes   . GERD (gastroesophageal reflux disease)   . History of echocardiogram    Echo 6/19:  Mild conc LVH, EF 65-70, normal wall motion, normal diastolic function, mild TR, PASP upper limits of normal  . Hyperlipemia    managed with diet  . Muscle spasm of back   . Numbness and tingling of left lower extremity   . Obesity   . Panic disorder   . Prediabetes     . PVC's (premature ventricular contractions) 03/21/2007   has worn several monitors; 1st age 72  . Status post complete hysterectomy     Past Surgical History:  Procedure Laterality Date  . ABDOMINAL HYSTERECTOMY    . DILATION AND CURETTAGE OF UTERUS      Prior to Admission medications   Medication Sig Start Date End Date Taking? Authorizing Provider  albuterol (PROAIR HFA) 108 (90 Base) MCG/ACT inhaler ProAir HFA 90 mcg/actuation aerosol inhaler  INHALE 1 TO 2 EVERY 4 TO 6 HOURS AS NEEDED WHEEZING 12/12/19  Yes [provider]  ALPRAZolam (XANAX) 0.5 MG tablet Take 0.5 mg by mouth 3 (three) times daily as needed for anxiety.   Yes [provider]  aspirin EC 81 MG tablet Take 81 mg by mouth daily. Swallow whole.   Yes [provider]  cyclobenzaprine (FLEXERIL) 5 MG tablet Take 1 tablet (5 mg total) by mouth 3 (three) times daily as needed. 04/05/20  Yes Menshew, Dannielle Karvonen, PA-C  FLUoxetine (PROZAC) 20 MG capsule Take 60 mg by mouth daily. 02/18/20  Yes [provider]  ibuprofen (ADVIL,MOTRIN) 200 MG tablet Take 400 mg by mouth every 6 (six) hours as needed for moderate pain.    Yes [provider]  metoprolol succinate (TOPROL XL) 25 MG 24 hr tablet Take 1 tablet (25  mg total) by mouth daily. 04/14/18  Yes Weaver, Scott T, PA-C  Omeprazole (PRILOSEC PO) Take 20 mg by mouth 1 day or 1 dose.    Yes [provider]  Na Sulfate-K Sulfate-Mg Sulf 17.5-3.13-1.6 GM/177ML SOLN At 5 PM the day before procedure take 1 bottle and 5 hours before procedure take 1 bottle. 05/20/20   Virgel Manifold, MD    Family History  Problem Relation Age of Onset  . Arthritis Mother   . Heart failure Father   . COPD Father   . Diabetes Father   . Heart attack Father 27  . ALS Paternal Aunt   . ALS Cousin   . ALS Cousin   . Breast cancer Maternal Grandmother   . Stroke Maternal Grandmother   . Heart attack Maternal Grandmother   . Suicidality  Maternal Grandfather   . Stroke Paternal Grandmother   . Stroke Paternal Grandfather      Social History   Tobacco Use  . Smoking status: Former Smoker    Packs/day: 0.50    Types: Cigarettes  . Smokeless tobacco: Never Used  . Tobacco comment: Quit 2014  Vaping Use  . Vaping Use: Never used  Substance Use Topics  . Alcohol use: No  . Drug use: No    Allergies as of 05/20/2020 - Review Complete 05/20/2020  Allergen Reaction Noted  . Bupropion Other (See Comments) 03/20/2018  . Buspar [buspirone] Other (See Comments) 03/20/2018  . Cymbalta [duloxetine hcl] Other (See Comments) 03/20/2018  . Pristiq [desvenlafaxine] Other (See Comments) 03/20/2018  . Zoloft [sertraline] Other (See Comments) 03/20/2018    Review of Systems:    All systems reviewed and negative except where noted in HPI.   Physical Exam:  BP 120/72   Pulse 77   Temp 98.1 F (36.7 C) (Oral)   Ht 5\' 5"  (1.651 m)   Wt 295 lb (133.8 kg)   BMI 49.09 kg/m  No LMP recorded. Patient has had a hysterectomy. Psych:  Alert and cooperative. Normal mood and affect. General:   Alert,  Well-developed, well-nourished, pleasant and cooperative in NAD Head:  Normocephalic and atraumatic. Eyes:  Sclera clear, no icterus.   Conjunctiva pink. Ears:  Normal auditory acuity. Nose:  No deformity, discharge, or lesions. Mouth:  No deformity or lesions,oropharynx pink & moist. Neck:  Supple; no masses or thyromegaly. Abdomen:  Normal bowel sounds.  No bruits.  Soft, non-tender and non-distended without masses, hepatosplenomegaly or hernias noted.  No guarding or rebound tenderness.    Rectal exam: No perianal skin lesions or hemorrhoids.  DRE with no stool in rectal vault or blood Msk:  Symmetrical without gross deformities. Good, equal movement & strength bilaterally. Pulses:  Normal pulses noted. Extremities:  No clubbing or edema.  No cyanosis. Neurologic:  Alert and oriented x3;  grossly normal neurologically. Skin:   Intact without significant lesions or rashes. No jaundice. Lymph Nodes:  No significant cervical adenopathy. Psych:  Alert and cooperative. Normal mood and affect.   Labs: CBC    Component Value Date/Time   WBC 10.1 12/09/2016 1513   RBC 4.93 12/09/2016 1513   HGB 14.0 12/09/2016 1513   HCT 42.6 12/09/2016 1513   PLT 236 12/09/2016 1513   MCV 86.4 12/09/2016 1513   MCH 28.4 12/09/2016 1513   MCHC 32.9 12/09/2016 1513   RDW 13.7 12/09/2016 1513   LYMPHSABS 2.5 12/09/2016 1513   MONOABS 0.7 12/09/2016 1513   EOSABS 0.1 12/09/2016 1513   BASOSABS 0.0  12/09/2016 1513   CMP     Component Value Date/Time   NA 139 04/11/2018 1115   K 4.2 04/11/2018 1115   CL 101 04/11/2018 1115   CO2 22 04/11/2018 1115   GLUCOSE 104 (H) 04/11/2018 1115   GLUCOSE 96 12/09/2016 1513   BUN 10 04/11/2018 1115   CREATININE 0.70 04/11/2018 1115   CALCIUM 8.4 (L) 04/11/2018 1115   PROT 6.8 12/09/2016 1513   ALBUMIN 3.8 12/09/2016 1513   AST 22 12/09/2016 1513   ALT 24 12/09/2016 1513   ALKPHOS 90 12/09/2016 1513   BILITOT 0.4 12/09/2016 1513   GFRNONAA 110 04/11/2018 1115   GFRAA 127 04/11/2018 1115    Imaging Studies: No results found.  Assessment and Plan:   RILEI KRAVITZ is a 39 y.o. y/o female has been referred for "colitis"  Patient's initial complaint to her referring provider was increased frequency of blood per rectum.  However, on review of systems she reveals that she has been having 2-3 bowel movements a day when her normal was 1 formed bowel movement a day.  This accompanied with the blood per rectum, is concerning for possible IBD  Patient also qualifies for early screening given possible history in father of advanced polyp.  Father passed away so we are unable to confirm this history with documentation  I have discussed alternative options, risks & benefits,  which include, but are not limited to, bleeding, infection, perforation,respiratory complication & drug reaction.  The  patient agrees with this plan & written consent will be obtained.       Dr Belinda [name]  Speech recognition software was used to dictate the above note.

## 2020-05-30 ENCOUNTER — Other Ambulatory Visit
Admission: RE | Admit: 2020-05-30 | Discharge: 2020-05-30 | Disposition: A | Discharge status: Home / Self Care | Payer: BC Managed Care – PPO | Source: Ambulatory Visit | Attending: Gastroenterology | Admitting: Gastroenterology

## 2020-05-30 ENCOUNTER — Other Ambulatory Visit: Payer: Self-pay

## 2020-05-30 DIAGNOSIS — Z20822 Contact with and (suspected) exposure to covid-19: Secondary | ICD-10-CM | POA: Diagnosis not present

## 2020-05-30 DIAGNOSIS — Z01812 Encounter for preprocedural laboratory examination: Secondary | ICD-10-CM | POA: Insufficient documentation

## 2020-05-30 LAB — SARS CORONAVIRUS 2 (TAT 6-24 HRS): SARS Coronavirus 2: NEGATIVE

## 2020-06-02 ENCOUNTER — Encounter: Payer: Self-pay | Admitting: Gastroenterology

## 2020-06-03 ENCOUNTER — Other Ambulatory Visit: Payer: Self-pay

## 2020-06-03 ENCOUNTER — Encounter: Payer: Self-pay | Admitting: Gastroenterology

## 2020-06-03 ENCOUNTER — Ambulatory Visit: Payer: BC Managed Care – PPO | Admitting: Anesthesiology

## 2020-06-03 ENCOUNTER — Ambulatory Visit
Admission: RE | Admit: 2020-06-03 | Discharge: 2020-06-03 | Disposition: A | Discharge status: Home / Self Care | Payer: BC Managed Care – PPO | Source: Ambulatory Visit | Attending: Gastroenterology | Admitting: Gastroenterology

## 2020-06-03 ENCOUNTER — Encounter
Admission: RE | Disposition: A | Discharge status: Home / Self Care | Payer: Self-pay | Source: Ambulatory Visit | Attending: Gastroenterology

## 2020-06-03 DIAGNOSIS — F418 Other specified anxiety disorders: Secondary | ICD-10-CM | POA: Diagnosis not present

## 2020-06-03 DIAGNOSIS — Z87891 Personal history of nicotine dependence: Secondary | ICD-10-CM | POA: Diagnosis not present

## 2020-06-03 DIAGNOSIS — Z6841 Body Mass Index (BMI) 40.0 and over, adult: Secondary | ICD-10-CM | POA: Diagnosis not present

## 2020-06-03 DIAGNOSIS — J45909 Unspecified asthma, uncomplicated: Secondary | ICD-10-CM | POA: Insufficient documentation

## 2020-06-03 DIAGNOSIS — K529 Noninfective gastroenteritis and colitis, unspecified: Secondary | ICD-10-CM | POA: Diagnosis not present

## 2020-06-03 DIAGNOSIS — Z7982 Long term (current) use of aspirin: Secondary | ICD-10-CM | POA: Diagnosis not present

## 2020-06-03 DIAGNOSIS — Z8371 Family history of colonic polyps: Secondary | ICD-10-CM | POA: Insufficient documentation

## 2020-06-03 DIAGNOSIS — K219 Gastro-esophageal reflux disease without esophagitis: Secondary | ICD-10-CM | POA: Diagnosis not present

## 2020-06-03 DIAGNOSIS — D124 Benign neoplasm of descending colon: Secondary | ICD-10-CM | POA: Insufficient documentation

## 2020-06-03 DIAGNOSIS — G709 Myoneural disorder, unspecified: Secondary | ICD-10-CM | POA: Diagnosis not present

## 2020-06-03 DIAGNOSIS — K648 Other hemorrhoids: Secondary | ICD-10-CM | POA: Diagnosis not present

## 2020-06-03 DIAGNOSIS — E785 Hyperlipidemia, unspecified: Secondary | ICD-10-CM | POA: Insufficient documentation

## 2020-06-03 DIAGNOSIS — K635 Polyp of colon: Secondary | ICD-10-CM

## 2020-06-03 DIAGNOSIS — Z83719 Family history of colon polyps, unspecified: Secondary | ICD-10-CM

## 2020-06-03 DIAGNOSIS — D126 Benign neoplasm of colon, unspecified: Secondary | ICD-10-CM | POA: Diagnosis not present

## 2020-06-03 DIAGNOSIS — Z1211 Encounter for screening for malignant neoplasm of colon: Secondary | ICD-10-CM | POA: Insufficient documentation

## 2020-06-03 DIAGNOSIS — Z79899 Other long term (current) drug therapy: Secondary | ICD-10-CM | POA: Diagnosis not present

## 2020-06-03 HISTORY — PX: COLONOSCOPY WITH PROPOFOL: SHX5780

## 2020-06-03 SURGERY — COLONOSCOPY WITH PROPOFOL
Anesthesia: General

## 2020-06-03 MED ORDER — ONDANSETRON HCL 4 MG/2ML IJ SOLN
INTRAMUSCULAR | Status: DC | PRN
  Administered 2020-06-03: 4 mg via INTRAVENOUS

## 2020-06-03 MED ORDER — PROPOFOL 500 MG/50ML IV EMUL
INTRAVENOUS | Status: AC
  Filled 2020-06-03: qty 50

## 2020-06-03 MED ORDER — PHENYLEPHRINE HCL (PRESSORS) 10 MG/ML IV SOLN
INTRAVENOUS | Status: AC
  Filled 2020-06-03: qty 1

## 2020-06-03 MED ORDER — FENTANYL CITRATE (PF) 100 MCG/2ML IJ SOLN
INTRAMUSCULAR | Status: DC | PRN
  Administered 2020-06-03 (×2): 25 ug via INTRAVENOUS

## 2020-06-03 MED ORDER — SODIUM CHLORIDE 0.9 % IV SOLN: INTRAVENOUS | Status: DC

## 2020-06-03 MED ORDER — PROPOFOL 10 MG/ML IV BOLUS
INTRAVENOUS | Status: DC | PRN
  Administered 2020-06-03 (×4): 20 mg via INTRAVENOUS
  Administered 2020-06-03: 60 mg via INTRAVENOUS
  Administered 2020-06-03: 20 mg via INTRAVENOUS

## 2020-06-03 MED ORDER — LIDOCAINE HCL (PF) 2 % IJ SOLN
INTRAMUSCULAR | Status: AC
  Filled 2020-06-03: qty 5

## 2020-06-03 MED ORDER — FENTANYL CITRATE (PF) 100 MCG/2ML IJ SOLN
INTRAMUSCULAR | Status: AC
  Filled 2020-06-03: qty 2

## 2020-06-03 MED ORDER — PROPOFOL 500 MG/50ML IV EMUL
INTRAVENOUS | Status: DC | PRN
  Administered 2020-06-03: 150 ug/kg/min via INTRAVENOUS

## 2020-06-03 MED ORDER — LIDOCAINE HCL (CARDIAC) PF 100 MG/5ML IV SOSY
PREFILLED_SYRINGE | INTRAVENOUS | Status: DC | PRN
  Administered 2020-06-03: 50 mg via INTRAVENOUS

## 2020-06-03 NOTE — H&P (Signed)
Belinda Antigua, MD [address] [place], [place], [place], [address] [address], Belinda [name], [place], [place], [address] Phone: [phone]  Fax: [phone]  Primary Care Physician:  Practice, [facility]   Pre-Procedure History & Physical: HPI:  Belinda [name] is a 39 y.o. female is here for a colonoscopy.   Past Medical History:  Diagnosis Date   Allergic rhinitis    Asthma    better after quitting smoking   Depression with anxiety    Elevated blood pressure reading    occasional elevated BPs; no medication yet   Fibrocystic breast changes    GERD (gastroesophageal reflux disease)    History of echocardiogram    Echo 6/19:  Mild conc LVH, EF 65-70, normal wall motion, normal diastolic function, mild TR, PASP upper limits of normal   Hyperlipemia    managed with diet   Muscle spasm of back    Numbness and tingling of left lower extremity    Obesity    Panic disorder    Prediabetes    PVC's (premature ventricular contractions) 03/21/2007   has worn several monitors; 1st age 39   Status post complete hysterectomy     Past Surgical History:  Procedure Laterality Date   ABDOMINAL HYSTERECTOMY     DILATION AND CURETTAGE OF UTERUS      Prior to Admission medications   Medication Sig Start Date End Date Taking? Authorizing Provider  albuterol (PROAIR HFA) 108 (90 Base) MCG/ACT inhaler ProAir HFA 90 mcg/actuation aerosol inhaler  INHALE 1 TO 2 EVERY 4 TO 6 HOURS AS NEEDED WHEEZING 12/12/19  Yes [provider]  ALPRAZolam (XANAX) 0.5 MG tablet Take 0.5 mg by mouth 3 (three) times daily as needed for anxiety.   Yes [provider]  aspirin EC 81 MG tablet Take 81 mg by mouth daily. Swallow whole.   Yes [provider]  cyclobenzaprine (FLEXERIL) 5 MG tablet Take 1 tablet (5 mg total) by mouth 3 (three) times daily as needed. 04/05/20  Yes Menshew, Dannielle Karvonen, PA-C  FLUoxetine (PROZAC) 20 MG capsule Take 60 mg by  mouth daily. 02/18/20  Yes [provider]  ibuprofen (ADVIL,MOTRIN) 200 MG tablet Take 400 mg by mouth every 6 (six) hours as needed for moderate pain.    Yes [provider]  metoprolol succinate (TOPROL XL) 25 MG 24 hr tablet Take 1 tablet (25 mg total) by mouth daily. 04/14/18  Yes Weaver, Scott T, PA-C  Na Sulfate-K Sulfate-Mg Sulf 17.5-3.13-1.6 GM/177ML SOLN At 5 PM the day before procedure take 1 bottle and 5 hours before procedure take 1 bottle. 05/20/20  Yes Belinda [name] B, MD  Omeprazole (PRILOSEC PO) Take 20 mg by mouth 1 day or 1 dose.    Yes [provider]    Allergies as of 05/20/2020 - Review Complete 05/20/2020  Allergen Reaction Noted   Bupropion Other (See Comments) 03/20/2018   Buspar [buspirone] Other (See Comments) 03/20/2018   Cymbalta [duloxetine hcl] Other (See Comments) 03/20/2018   Pristiq [desvenlafaxine] Other (See Comments) 03/20/2018   Zoloft [sertraline] Other (See Comments) 03/20/2018    Family History  Problem Relation Age of Onset   Arthritis Mother    Heart failure Father    COPD Father    Diabetes Father    Heart attack Father 3   ALS Paternal Aunt    ALS Cousin    ALS Cousin    Breast cancer Maternal Grandmother    Stroke Maternal Grandmother  Heart attack Maternal Grandmother    Suicidality Maternal Grandfather    Stroke Paternal Grandmother    Stroke Paternal Grandfather     Social History   Socioeconomic History   Marital status: Married    Spouse name: Not on file   Number of children: 2   Years of education: GED   Highest education level: Not on file  Occupational History   Occupation: Barista  Tobacco Use   Smoking status: Former Smoker    Packs/day: 0.50    Types: Cigarettes   Smokeless tobacco: Never Used   Tobacco comment: Quit 2014  Vaping Use   Vaping Use: Never used  Substance and Sexual Activity   Alcohol use: No   Drug use: No   Sexual  activity: Yes    Birth control/protection: Surgical  Other Topics Concern   Not on file  Social History Narrative   Originally from this area ([place], [place])   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   Right-handed.   3 cups caffeine per day.   Social Determinants of Health   Financial Resource Strain:    Difficulty of Paying Living Expenses:   Food Insecurity:    Worried About Charity fundraiser in the Last Year:    Arboriculturist in the Last Year:   Transportation Needs:    Film/video editor (Medical):    Lack of Transportation (Non-Medical):   Physical Activity:    Days of Exercise per Week:    Minutes of Exercise per Session:   Stress:    Feeling of Stress :   Social Connections:    Frequency of Communication with Friends and Family:    Frequency of Social Gatherings with Friends and Family:    Attends Religious Services:    Active Member of Clubs or Organizations:    Attends Music therapist:    Marital Status:   Intimate Partner Violence:    Fear of Current or Ex-Partner:    Emotionally Abused:    Physically Abused:    Sexually Abused:     Review of Systems: See HPI, otherwise negative ROS  Physical Exam: BP (!) 145/81    Pulse 86    Temp (!) 97.2 F (36.2 C) (Temporal)    Resp 17    Ht 5\' 5"  (1.651 m)    Wt 132.9 kg    LMP  (LMP Unknown)    SpO2 100%    BMI 48.76 kg/m  General:   Alert,  pleasant and cooperative in NAD Head:  Normocephalic and atraumatic. Neck:  Supple; no masses or thyromegaly. Lungs:  Clear throughout to auscultation, normal respiratory effort.    Heart:  +S1, +S2, Regular rate and rhythm, No edema. Abdomen:  Soft, nontender and nondistended. Normal bowel sounds, without guarding, and without rebound.   Neurologic:  Alert and  oriented x4;  grossly normal neurologically.  Impression/Plan: [name] is here for a colonoscopy to be performed for high risk screening.  Father had advanced polyps  Risks, benefits, limitations, and alternatives regarding  colonoscopy have been reviewed with the patient.  Questions have been answered.  All parties agreeable.   Virgel Manifold, MD  40/12/2019, 8:38 AM

## 2020-06-03 NOTE — Transfer of Care (Signed)
Immediate Anesthesia Transfer of Care Note  Patient: Belinda [name]  Procedure(s) Performed: COLONOSCOPY WITH PROPOFOL (N/A )  Patient Location: Endoscopy Unit  Anesthesia Type:General  Level of Consciousness: drowsy and patient cooperative  Airway & Oxygen Therapy: Patient Spontanous Breathing and Patient connected to face mask oxygen  Post-op Assessment: Report given to RN and Post -op Vital signs reviewed and stable  Post vital signs: Reviewed and stable  Last Vitals:  Vitals Value Taken Time  BP 111/61 06/03/20 0917  Temp 36.6 C 06/03/20 0916  Pulse 90 06/03/20 0918  Resp 18 06/03/20 0918  SpO2 97 % 06/03/20 0918  Vitals shown include unvalidated device data.  Last Pain:  Vitals:   06/03/20 0916  TempSrc: Temporal         Complications: No complications documented.

## 2020-06-03 NOTE — Anesthesia Preprocedure Evaluation (Signed)
Anesthesia Evaluation  Patient identified by MRN, date of birth, ID band Patient awake    Reviewed: Allergy & Precautions, H&P , NPO status , Patient's Chart, lab work & pertinent test results, reviewed documented beta blocker date and time   Airway Mallampati: III   Neck ROM: full    Dental  (+) Teeth Intact   Pulmonary asthma , former smoker,    Pulmonary exam normal        Cardiovascular Exercise Tolerance: Poor Normal cardiovascular exam+ dysrhythmias  Rhythm:regular Rate:Normal     Neuro/Psych PSYCHIATRIC DISORDERS Anxiety Depression  Neuromuscular disease    GI/Hepatic Neg liver ROS, GERD  Medicated,  Endo/Other  Morbid obesity  Renal/GU negative Renal ROS  negative genitourinary   Musculoskeletal   Abdominal   Peds  Hematology negative hematology ROS (+)   Anesthesia Other Findings Past Medical History: No date: Allergic rhinitis No date: Asthma     Comment:  better after quitting smoking No date: Depression with anxiety No date: Elevated blood pressure reading     Comment:  occasional elevated BPs; no medication yet No date: Fibrocystic breast changes No date: GERD (gastroesophageal reflux disease) No date: History of echocardiogram     Comment:  Echo 6/19:  Mild conc LVH, EF 65-70, normal wall motion,              normal diastolic function, mild TR, PASP upper limits of               normal No date: Hyperlipemia     Comment:  managed with diet No date: Muscle spasm of back No date: Numbness and tingling of left lower extremity No date: Obesity No date: Panic disorder No date: Prediabetes 03/21/2007: PVC's (premature ventricular contractions)     Comment:  has worn several monitors; 1st age 13 No date: Status post complete hysterectomy Past Surgical History: No date: ABDOMINAL HYSTERECTOMY No date: DILATION AND CURETTAGE OF UTERUS BMI    Body Mass Index: 48.76 kg/m      Reproductive/Obstetrics negative OB ROS                             Anesthesia Physical Anesthesia Plan  ASA: III  Anesthesia Plan: General   Post-op Pain Management:    Induction:   PONV Risk Score and Plan:   Airway Management Planned:   Additional Equipment:   Intra-op Plan:   Post-operative Plan:   Informed Consent: I have reviewed the patients History and Physical, chart, labs and discussed the procedure including the risks, benefits and alternatives for the proposed anesthesia with the patient or authorized representative who has indicated his/her understanding and acceptance.     Dental Advisory Given  Plan Discussed with: CRNA  Anesthesia Plan Comments:         Anesthesia Quick Evaluation

## 2020-06-03 NOTE — Op Note (Addendum)
[facility] Gastroenterology Patient Name: Belinda [name] Procedure Date: 06/03/2020 8:40 AM MRN: [id number] Account #: [id number] Date of Birth: Feb 05, 1981 Admit Type: Outpatient Age: 39 Room: [facility] ENDO ROOM 1 Gender: Female Note Status: Finalized Procedure:             Colonoscopy Indications:           Colon cancer screening in patient with 1st-degree                         relative having advanced adenoma of the colon before                         age 58, Incidental - Rectal bleeding Providers:             Daishawn Lauf B. Bonna Gains MD, MD Referring MD:          [facility] (Referring MD) Medicines:             Monitored Anesthesia Care Complications:         No immediate complications. Procedure:             Pre-Anesthesia Assessment:                        - ASA Grade Assessment: II - A patient with mild                         systemic disease.                        - Prior to the procedure, a History and Physical was                         performed, and patient medications, allergies and                         sensitivities were reviewed. The patient's tolerance                         of previous anesthesia was reviewed.                        - The risks and benefits of the procedure and the                         sedation options and risks were discussed with the                         patient. All questions were answered and informed                         consent was obtained.                        - Patient identification and proposed procedure were                         verified prior to the procedure by the physician, the  nurse, the anesthesiologist, the anesthetist and the                         technician. The procedure was verified in the                         procedure room.                        After obtaining informed consent, the colonoscope was                         passed under direct  vision. Throughout the procedure,                         the patient's blood pressure, pulse, and oxygen                         saturations were monitored continuously. The                         Colonoscope was introduced through the anus and                         advanced to the the cecum, identified by appendiceal                         orifice and ileocecal valve. The colonoscopy was                         performed with ease. The patient tolerated the                         procedure well. The quality of the bowel preparation                         was good. Findings:      The perianal and digital rectal examinations were normal.      A 7 mm polyp was found in the descending colon. The polyp was       semi-pedunculated. The polyp was removed with a cold snare. Resection       and retrieval were complete. For hemostasis, one hemostatic clip was       successfully placed. There was no bleeding at the end of the procedure.      The exam was otherwise without abnormality.      The rectum, sigmoid colon, descending colon, transverse colon, ascending       colon and cecum appeared normal. Biopsies for histology were taken with       a cold forceps from the entire colon for evaluation of microscopic       colitis.      Non-bleeding internal hemorrhoids were found during retroflexion. Impression:            - One 7 mm polyp in the descending colon, removed with                         a cold snare. Resected and retrieved. Clip was placed.                        -  The examination was otherwise normal.                        - The rectum, sigmoid colon, descending colon,                         transverse colon, ascending colon and cecum are                         normal. Biopsied.                        - Non-bleeding internal hemorrhoids. Recommendation:        - Discharge patient to home (with escort).                        - Advance diet as tolerated.                        -  Continue present medications.                        - Await pathology results.                        - Repeat colonoscopy in 5 years.                        - The findings and recommendations were discussed with                         the patient.                        - The findings and recommendations were discussed with                         the patient's family.                        - Return to primary care physician as previously                         scheduled.                        - High fiber diet. Procedure Code(s):     --- Professional ---                        [phone], Colonoscopy, flexible; with removal of                         tumor(s), polyp(s), or other lesion(s) by snare                         technique                        45380, 59, Colonoscopy, flexible; with biopsy, single                         or multiple Diagnosis Code(s):     ---  Professional ---                        Z83.71, Family history of colonic polyps                        K63.5, Polyp of colon                        K64.8, Other hemorrhoids CPT copyright 2019 American Medical Association. All rights reserved. The codes documented in this report are preliminary and upon coder review may  be revised to meet current compliance requirements.  [name], MD Margretta Sidle B. Bonna Gains MD, MD 06/03/2020 9:19:21 AM This report has been signed electronically. Number of Addenda: 0 Note Initiated On: 06/03/2020 8:40 AM Scope Withdrawal Time: 0 hours 16 minutes 34 seconds  Total Procedure Duration: 0 hours 24 minutes 13 seconds  Estimated Blood Loss:  Estimated blood loss: none.      [facility]

## 2020-06-04 ENCOUNTER — Encounter: Payer: Self-pay | Admitting: Gastroenterology

## 2020-06-04 ENCOUNTER — Telehealth: Payer: Self-pay

## 2020-06-04 ENCOUNTER — Other Ambulatory Visit: Payer: Self-pay

## 2020-06-04 ENCOUNTER — Telehealth: Payer: BC Managed Care – PPO | Admitting: Gastroenterology

## 2020-06-04 DIAGNOSIS — R030 Elevated blood-pressure reading, without diagnosis of hypertension: Secondary | ICD-10-CM | POA: Diagnosis not present

## 2020-06-04 DIAGNOSIS — J45909 Unspecified asthma, uncomplicated: Secondary | ICD-10-CM | POA: Diagnosis not present

## 2020-06-04 DIAGNOSIS — K922 Gastrointestinal hemorrhage, unspecified: Secondary | ICD-10-CM | POA: Diagnosis not present

## 2020-06-04 DIAGNOSIS — Z86010 Personal history of colonic polyps: Secondary | ICD-10-CM | POA: Diagnosis not present

## 2020-06-04 DIAGNOSIS — K91840 Postprocedural hemorrhage and hematoma of a digestive system organ or structure following a digestive system procedure: Secondary | ICD-10-CM | POA: Insufficient documentation

## 2020-06-04 DIAGNOSIS — Z79899 Other long term (current) drug therapy: Secondary | ICD-10-CM | POA: Insufficient documentation

## 2020-06-04 DIAGNOSIS — Z7982 Long term (current) use of aspirin: Secondary | ICD-10-CM | POA: Insufficient documentation

## 2020-06-04 DIAGNOSIS — K219 Gastro-esophageal reflux disease without esophagitis: Secondary | ICD-10-CM | POA: Insufficient documentation

## 2020-06-04 DIAGNOSIS — Z87891 Personal history of nicotine dependence: Secondary | ICD-10-CM | POA: Insufficient documentation

## 2020-06-04 LAB — COMPREHENSIVE METABOLIC PANEL
ALT: 22 U/L (ref 0–44)
AST: 21 U/L (ref 15–41)
Albumin: 3.7 g/dL (ref 3.5–5.0)
Alkaline Phosphatase: 93 U/L (ref 38–126)
Anion gap: 10 (ref 5–15)
BUN: 15 mg/dL (ref 6–20)
CO2: 25 mmol/L (ref 22–32)
Calcium: 8.3 mg/dL — ABNORMAL LOW (ref 8.9–10.3)
Chloride: 100 mmol/L (ref 98–111)
Creatinine, Ser: 0.91 mg/dL (ref 0.44–1.00)
GFR calc Af Amer: >60 mL/min (ref >60)
GFR calc non Af Amer: >60 mL/min (ref >60)
Glucose, Bld: 110 mg/dL — ABNORMAL HIGH (ref 70–99)
Potassium: 3.7 mmol/L (ref 3.5–5.1)
Sodium: 135 mmol/L (ref 135–145)
Total Bilirubin: 0.5 mg/dL (ref 0.3–1.2)
Total Protein: 6.9 g/dL (ref 6.5–8.1)

## 2020-06-04 LAB — TYPE AND SCREEN
ABO/RH(D): A POS
Antibody Screen: NEGATIVE

## 2020-06-04 LAB — CBC
HCT: 36.8 % (ref 36.0–46.0)
Hemoglobin: 12.4 g/dL (ref 12.0–15.0)
MCH: 28.2 pg (ref 26.0–34.0)
MCHC: 33.7 g/dL (ref 30.0–36.0)
MCV: 83.6 fL (ref 80.0–100.0)
Platelets: 214 10*3/uL (ref 150–400)
RBC: 4.4 MIL/uL (ref 3.87–5.11)
RDW: 13.7 % (ref 11.5–15.5)
WBC: 11.1 10*3/uL — ABNORMAL HIGH (ref 4.0–10.5)
nRBC: 0 % (ref 0.0–0.2)

## 2020-06-04 LAB — SURGICAL PATHOLOGY

## 2020-06-04 NOTE — Telephone Encounter (Signed)
Patient called stating that yesterday she had her colonoscopy and since then she has had some abdominal cramping and bloating (which is normal). Yesterday night she was able to pass gas and felt a bit better. But today she went to work and had the urge to use the restroom, had a bowel movement and passed a lot of bright red blood. she noticed it when she wiped and on the toilet. Patient stated that she has not had a menstrual cycle since the early 2000s and she has abdominal pain and cramping and passed blood again. Patient stated that she had taken pics and I told her to send them thru myChart for the doctor to take a look at it. I told patient to please got to the ED if she noticed the bright red blood again. Patient understood. Thsi message was also sent to Dr. Bonna Gains through secure chat and told her to please contact patient through [place] or a phone call.

## 2020-06-04 NOTE — Anesthesia Postprocedure Evaluation (Signed)
Anesthesia Post Note  Patient: Belinda [name]  Procedure(s) Performed: COLONOSCOPY WITH PROPOFOL (N/A )  Patient location during evaluation: PACU Anesthesia Type: General Level of consciousness: awake and alert Pain management: pain level controlled Vital Signs Assessment: post-procedure vital signs reviewed and stable Respiratory status: spontaneous breathing, nonlabored ventilation, respiratory function stable and patient connected to nasal cannula oxygen Cardiovascular status: blood pressure returned to baseline and stable Postop Assessment: no apparent nausea or vomiting Anesthetic complications: no   No complications documented.   Last Vitals:  Vitals:   06/03/20 0940 06/03/20 0947  BP: (!) 124/52 (!) 98/50  Pulse: 84 79  Resp: 14 17  Temp:    SpO2: 99% 100%    Last Pain:  Vitals:   06/03/20 0920  TempSrc: Temporal                 Molli Barrows

## 2020-06-04 NOTE — Telephone Encounter (Signed)
Dr. Bonna Gains called me and recommended for the patient to go to the ED since she was still passing bright red blood. I then called patient to let her know what Dr. Michele Mcalpine recommendations were and stated that she would go to the ED.

## 2020-06-04 NOTE — ED Triage Notes (Signed)
Pt comes POV after colonoscopy yesterday. Pt had polyp cut out and clip in place. Has had x2 episodes of bloody diarrhea today and GI doc wanted pt to be seen.

## 2020-06-05 ENCOUNTER — Encounter: Payer: Self-pay | Admitting: Gastroenterology

## 2020-06-05 ENCOUNTER — Emergency Department
Admission: EM | Admit: 2020-06-05 | Discharge: 2020-06-05 | Disposition: A | Discharge status: Home / Self Care | Payer: BC Managed Care – PPO | Attending: Emergency Medicine | Admitting: Emergency Medicine

## 2020-06-05 DIAGNOSIS — K91840 Postprocedural hemorrhage and hematoma of a digestive system organ or structure following a digestive system procedure: Secondary | ICD-10-CM

## 2020-06-05 DIAGNOSIS — K922 Gastrointestinal hemorrhage, unspecified: Secondary | ICD-10-CM

## 2020-06-05 LAB — HEMOGLOBIN AND HEMATOCRIT, BLOOD
HCT: 38.2 % (ref 36.0–46.0)
Hemoglobin: 12.3 g/dL (ref 12.0–15.0)

## 2020-06-05 NOTE — ED Provider Notes (Signed)
[facility] Emergency Department Provider Note  ____________________________________________   First MD Initiated Contact with Patient 06/05/20 0301     (approximate)  I have reviewed the triage vital signs and the nursing notes.   HISTORY  Chief Complaint GI Bleeding    HPI Belinda [name] is a 39 y.o. female with medical history as listed below who presents for evaluation of rectal bleeding status post colonoscopy with polypectomy.  She had the procedure 2 days ago performed by Dr. .  She had a little bit of bleeding but then had an increase in the amount of bright red blood along with some abdominal cramping.  She sent a picture of the blood to Dr. Bonna Gains who contacted through house herself and suggested she come to the emergency department for additional evaluation.  Due to overwhelming ED and hospital patient volume, the patient waited nearly 9 hours for evaluation.  Blood work was performed when she was initially triaged.  The patient reports that she has had no additional bowel movements or bowel urgency since coming to the emergency department.  She has no abdominal pain.  Occasionally she has a little bit of aching but the discomfort has not increased since the original procedure. She denies fever/chills, sore throat, chest pain, shortness of breath, nausea, and vomiting. Nothing in particular makes the symptoms better or worse.        Past Medical History:  Diagnosis Date  . Allergic rhinitis   . Asthma    better after quitting smoking  . Depression with anxiety   . Elevated blood pressure reading    occasional elevated BPs; no medication yet  . Fibrocystic breast changes   . GERD (gastroesophageal reflux disease)   . History of echocardiogram    Echo 6/19:  Mild conc LVH, EF 65-70, normal wall motion, normal diastolic function, mild TR, PASP upper limits of normal  . Hyperlipemia    managed with diet  . Muscle spasm of back   . Numbness and  tingling of left lower extremity   . Obesity   . Panic disorder   . Prediabetes   . PVC's (premature ventricular contractions) 03/21/2007   has worn several monitors; 1st age 72  . Status post complete hysterectomy     Patient Active Problem List   Diagnosis Date Noted  . Family history of colonic polyps   . Polyp of colon   . Hemorrhoids 03/18/2020  . Lateral epicondylitis 11/21/2019  . Carpal tunnel syndrome 10/06/2017  . 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 Abnormal glucose 12/15/2016    Past Surgical History:  Procedure Laterality Date  . ABDOMINAL HYSTERECTOMY    . COLONOSCOPY WITH PROPOFOL N/A 06/03/2020   Procedure: COLONOSCOPY WITH PROPOFOL;  Surgeon: Virgel Manifold, MD;  Location: [facility] ENDOSCOPY;  Service: Endoscopy;  Laterality: N/A;  . DILATION AND CURETTAGE OF UTERUS      Prior to Admission medications   Medication Sig Start Date End Date Taking? Authorizing Provider  albuterol (PROAIR HFA) 108 (90 Base) MCG/ACT inhaler ProAir HFA 90 mcg/actuation aerosol inhaler  INHALE 1 TO 2 EVERY 4 TO 6 HOURS AS NEEDED WHEEZING 12/12/19   [provider]  ALPRAZolam Duanne Moron) 0.5 MG tablet Take 0.5 mg by mouth 3 (three) times daily as needed for anxiety.    [provider]  aspirin EC 81 MG tablet Take 81 mg by mouth daily. Swallow whole.    [provider]  cyclobenzaprine (FLEXERIL) 5  MG tablet Take 1 tablet (5 mg total) by mouth 3 (three) times daily as needed. 04/05/20   Menshew, Dannielle Karvonen, PA-C  FLUoxetine (PROZAC) 20 MG capsule Take 60 mg by mouth daily. 02/18/20   [provider]  ibuprofen (ADVIL,MOTRIN) 200 MG tablet Take 400 mg by mouth every 6 (six) hours as needed for moderate pain.     [provider]  metoprolol succinate (TOPROL XL) 25 MG 24 hr tablet Take 1 tablet (25 mg total) by mouth daily. 04/14/18   Kathlen Mody, Scott T, PA-C  Na Sulfate-K Sulfate-Mg Sulf 17.5-3.13-1.6 GM/177ML SOLN At 5 PM  the day before procedure take 1 bottle and 5 hours before procedure take 1 bottle. 05/20/20   Virgel Manifold, MD  Omeprazole (PRILOSEC PO) Take 20 mg by mouth 1 day or 1 dose.     [provider]    Allergies Bupropion, Buspar [buspirone], Cymbalta [duloxetine hcl], Pristiq [desvenlafaxine], and Zoloft [sertraline]  Family History  Problem Relation Age of Onset  . Arthritis Mother   . Heart failure Father   . COPD Father   . Diabetes Father   . Heart attack Father 16  . ALS Paternal Aunt   . ALS Cousin   . ALS Cousin   . Breast cancer Maternal Grandmother   . Stroke Maternal Grandmother   . Heart attack Maternal Grandmother   . Suicidality Maternal Grandfather   . Stroke Paternal Grandmother   . Stroke Paternal Grandfather     Social History Social History   Tobacco Use  . Smoking status: Former Smoker    Packs/day: 0.50    Types: Cigarettes  . Smokeless tobacco: Never Used  . Tobacco comment: Quit 2014  Vaping Use  . Vaping Use: Never used  Substance Use Topics  . Alcohol use: No  . Drug use: No    Review of Systems Constitutional: No fever/chills Eyes: No visual changes. ENT: No sore throat. Cardiovascular: Denies chest pain. Respiratory: Denies shortness of breath. Gastrointestinal: Mild intermittent lower abdominal cramping/pain since her colonoscopy 2 days ago. Bright red blood per rectum after polypectomy. Genitourinary: Negative for dysuria. Musculoskeletal: Negative for neck pain.  Negative for back pain. Integumentary: Negative for rash. Neurological: Negative for headaches, focal weakness or numbness.   ____________________________________________   PHYSICAL EXAM:  VITAL SIGNS: ED Triage Vitals [06/04/20 1928]  Enc Vitals Group     BP (!) 180/87     Pulse Rate 95     Resp 18     Temp 99.1 F (37.3 C)     Temp Source Oral     SpO2 99 %     Weight 132 kg (291 lb 0.1 oz)     Height 1.651 m (5\' 5" )     Head Circumference       Peak Flow      Pain Score 3     Pain Loc      Pain Edu?      Excl. in [place]?     Constitutional: Alert and oriented.  Eyes: Conjunctivae are normal.  Head: Atraumatic. Nose: No congestion/rhinnorhea. Mouth/Throat: Patient is wearing a mask. Neck: No stridor.  No meningeal signs.   Cardiovascular: Normal rate, regular rhythm. Good peripheral circulation. Grossly normal heart sounds. Respiratory: Normal respiratory effort.  No retractions. Gastrointestinal: Soft and nontender. No distention. Normal external rectal exam with no evidence of hemorrhoids. Nontender exam. There is a small amount of bright red blood mixed in with light brown stool, heme positive.  ED nurse Seth Bake present throughout exam as chaperone. Musculoskeletal: No lower extremity tenderness nor edema. No gross deformities of extremities. Neurologic:  Normal speech and language. No gross focal neurologic deficits are appreciated.  Skin:  Skin is warm, dry and intact. Psychiatric: Mood and affect are normal. Speech and behavior are normal.  ____________________________________________   LABS (all labs ordered are listed, but only abnormal results are displayed)  Labs Reviewed  COMPREHENSIVE METABOLIC PANEL - Abnormal; Notable for the following components:      Result Value   Glucose, Bld 110 (*)    Calcium 8.3 (*)    All other components within normal limits  CBC - Abnormal; Notable for the following components:   WBC 11.1 (*)    All other components within normal limits  HEMOGLOBIN AND HEMATOCRIT, BLOOD  POC OCCULT BLOOD, ED  TYPE AND SCREEN   ____________________________________________  EKG  No indication for emergent EKG ____________________________________________  RADIOLOGY I, Hinda Kehr, personally viewed and evaluated these images (plain radiographs) as part of my medical decision making, as well as reviewing the written report by the radiologist.  ED MD interpretation: No indication for emergent  imaging  Official radiology report(s): No results found.  ____________________________________________   PROCEDURES   Procedure(s) performed (including Critical Care):  Procedures   ____________________________________________   INITIAL IMPRESSION / MDM / [place] / ED COURSE  As part of my medical decision making, I reviewed the following data within the [place] notes reviewed and incorporated, Labs reviewed , Old chart reviewed and Notes from prior ED visits   Differential diagnosis includes, but is not limited to, normal postprocedural bleeding, malfunction of clip, bowel perforation.  Patient has been stable for more than 9 hours in the emergency department. Vital signs are stable, no tachycardia, no fever. No tenderness to palpation on exam. Her initial hemoglobin is normal at greater than 12. She is having a small amount of persistent bleeding but I believe this is likely within normal limits. Given that she has absolutely no tenderness to palpation on exam, my suspicion for bowel perforation is extremely low and I do not believe she would benefit from any imaging. I talked with the patient about it and we agreed to repeat a hemoglobin to see if it is dropped substantially since her initial labs. If it is essentially normal and has dropped only a small amount since her initial labs, I think she will be appropriate for discharge and outpatient follow-up with GI. The patient agrees with the plan.       Clinical Course as of Jun 05 520  Thu Jun 05, 2020  0447 Essentially unchanged hemoglobin from the original obtained about 10 hours ago  Hemoglobin: 12.3 [CF]    Clinical Course User Index [CF] Hinda Kehr, MD     ____________________________________________  FINAL CLINICAL IMPRESSION(S) / ED DIAGNOSES  Final diagnoses:  Gastrointestinal hemorrhage, unspecified gastrointestinal hemorrhage type  Postoperative hemorrhage  involving digestive system following digestive system procedure     MEDICATIONS GIVEN DURING THIS VISIT:  Medications - No data to display   ED Discharge Orders    None      *Please note:  ALLEYA DEMETER was evaluated in Emergency Department on 06/05/2020 for the symptoms described in the history of present illness. She was evaluated in the context of the global COVID-19 pandemic, which necessitated consideration that the patient might be at risk for infection with the SARS-CoV-2 virus that causes COVID-19. Institutional protocols  and algorithms that pertain to the evaluation of patients at risk for COVID-19 are in a state of rapid change based on information released by regulatory bodies including the CDC and federal and state organizations. These policies and algorithms were followed during the patient's care in the ED.  Some ED evaluations and interventions may be delayed as a result of limited staffing during and after the pandemic.*  Note:  This document was prepared using Dragon voice recognition software and may include unintentional dictation errors.   Hinda Kehr, MD 06/05/20 [phone]

## 2020-06-05 NOTE — ED Notes (Signed)
Dr. Karma Greaser and Seth Bake, RN at bedside for rectal exam at this time

## 2020-06-05 NOTE — Discharge Instructions (Addendum)
As we discussed, even though you are still having some bleeding after your procedure, it does not appear to be dangerous at this time.  Your blood level is not dropping and given that you have no abdominal pain or tenderness at this time, you should be safe to be discharged and follow-up as an outpatient.  Please call the office of Dr. Bonna Gains to schedule a follow-up appointment.  Return to the emergency department if you develop new or worsening symptoms that concern you.

## 2020-06-17 ENCOUNTER — Telehealth (INDEPENDENT_AMBULATORY_CARE_PROVIDER_SITE_OTHER): Payer: BC Managed Care – PPO | Admitting: Gastroenterology

## 2020-06-17 ENCOUNTER — Encounter: Payer: Self-pay | Admitting: Gastroenterology

## 2020-06-17 DIAGNOSIS — K5900 Constipation, unspecified: Secondary | ICD-10-CM | POA: Diagnosis not present

## 2020-06-17 NOTE — Progress Notes (Signed)
Belinda Antigua, MD [address]  [place]  [place], [place] [address]  Main: [phone]  Fax: [phone]   Primary Care Physician: [facility], [facility]  Virtual Visit via Video Note  I connected with patient on 06/17/20 at  2:15 PM EDT by video (using [email or web address]) and verified that I am speaking with the correct person using two identifiers.   I discussed the limitations, risks, security and privacy concerns of performing an evaluation and management service by video and the availability of in person appointments. I also discussed with the patient that there may be a patient responsible charge related to this service. The patient expressed understanding and agreed to proceed.  Location of Patient: Home Location of Provider: Home Persons involved: Patient and provider only (Nursing staff checked in patient via phone but were not physically involved in the video interaction - see their notes)   History of Present Illness: Chief Complaint  Patient presents with  . Altered bowel habits    HPI: Belinda [name] is a 39 y.o. female here for follow-up of constipation.  Patient continues to have constipation and is not taking anything for it.  Underwent recent colonoscopy with random colon biopsies showing focal mild active colitis with no chronic changes.  1 tubular adenoma polyp removed.  Did have some bright red blood per rectum that has resolved.  Nonbleeding internal hemorrhoids were noted during the procedure.  Please see procedure report.  Current Outpatient Medications  Medication Sig Dispense Refill  . albuterol (PROAIR HFA) 108 (90 Base) MCG/ACT inhaler ProAir HFA 90 mcg/actuation aerosol inhaler  INHALE 1 TO 2 EVERY 4 TO 6 HOURS AS NEEDED WHEEZING    . ALPRAZolam (XANAX) 0.5 MG tablet Take 0.5 mg by mouth 3 (three) times daily as needed for anxiety.    Marland Kitchen aspirin EC 81 MG tablet Take 81 mg by mouth daily. Swallow whole.    . cyclobenzaprine (FLEXERIL) 5 MG  tablet Take 1 tablet (5 mg total) by mouth 3 (three) times daily as needed. 15 tablet 0  . FLUoxetine (PROZAC) 20 MG capsule Take 60 mg by mouth daily.    Marland Kitchen ibuprofen (ADVIL,MOTRIN) 200 MG tablet Take 400 mg by mouth every 6 (six) hours as needed for moderate pain.     . metoprolol succinate (TOPROL XL) 25 MG 24 hr tablet Take 1 tablet (25 mg total) by mouth daily. 90 tablet 3  . Omeprazole (PRILOSEC PO) Take 20 mg by mouth 1 day or 1 dose.      No current facility-administered medications for this visit.    Allergies as of 06/17/2020 - Review Complete 06/17/2020  Allergen Reaction Noted  . Bupropion Other (See Comments) 03/20/2018  . Buspar [buspirone] Other (See Comments) 03/20/2018  . Cymbalta [duloxetine hcl] Other (See Comments) 03/20/2018  . Pristiq [desvenlafaxine] Other (See Comments) 03/20/2018  . Zoloft [sertraline] Other (See Comments) 03/20/2018    Review of Systems:    All systems reviewed and negative except where noted in HPI.   Observations/Objective:  Labs: CMP     Component Value Date/Time   NA 135 06/04/2020 1932   NA 139 04/11/2018 1115   K 3.7 06/04/2020 1932   CL 100 06/04/2020 1932   CO2 25 06/04/2020 1932   GLUCOSE 110 (H) 06/04/2020 1932   BUN 15 06/04/2020 1932   BUN 10 04/11/2018 1115   CREATININE 0.91 06/04/2020 1932   CALCIUM 8.3 (L) 06/04/2020 1932   PROT 6.9 06/04/2020 1932  ALBUMIN 3.7 06/04/2020 1932   AST 21 06/04/2020 1932   ALT 22 06/04/2020 1932   ALKPHOS 93 06/04/2020 1932   BILITOT 0.5 06/04/2020 1932   GFRNONAA >60 06/04/2020 1932   GFRAA >60 06/04/2020 1932   Lab Results  Component Value Date   WBC 11.1 (H) 06/04/2020   HGB 12.3 06/05/2020   HCT 38.2 06/05/2020   MCV 83.6 06/04/2020   PLT 214 06/04/2020    Imaging Studies: No results found.  Assessment and Plan:   Belinda [name] is a 39 y.o. y/o female here for follow-up of constipation  Assessment and Plan: High-fiber diet MiraLAX daily with goal of 1-2 soft bowel  movements daily.  If not at goal, within 2 to 3 weeks, patient advised to call us and we can change therapy as appropriate.  If loose stools with the medication, patient asked to decrease the medication to every other day, or half dose daily.  Patient verbalized understanding  Self-limited colitis with no chronic changes noted on random colon biopsies could have been due to prep effect or NSAIDs.  Patient was advised to avoid frequent NSAIDs.  If patient has diarrhea, further blood per rectum or any concerning changes, colonoscopy with repeat biopsies can be considered in the future but not indicated at this time  Follow Up Instructions:    I discussed the assessment and treatment plan with the patient. The patient was provided an opportunity to ask questions and all were answered. The patient agreed with the plan and demonstrated an understanding of the instructions.   The patient was advised to call back or seek an in-person evaluation if the symptoms worsen or if the condition fails to improve as anticipated.  I provided 12 minutes of face-to-face time via video software during this encounter. Additional time was spent in reviewing patient's chart, placing orders etc.   Virgel Manifold, MD  Speech recognition software was used to dictate this note.

## 2020-06-25 ENCOUNTER — Ambulatory Visit: Payer: BC Managed Care – PPO | Admitting: Physician Assistant

## 2020-07-22 DIAGNOSIS — Z20822 Contact with and (suspected) exposure to covid-19: Secondary | ICD-10-CM | POA: Diagnosis not present

## 2020-07-22 DIAGNOSIS — Z03818 Encounter for observation for suspected exposure to other biological agents ruled out: Secondary | ICD-10-CM | POA: Diagnosis not present

## 2020-09-23 DIAGNOSIS — M62830 Muscle spasm of back: Secondary | ICD-10-CM | POA: Diagnosis not present

## 2020-09-23 DIAGNOSIS — I10 Essential (primary) hypertension: Secondary | ICD-10-CM | POA: Diagnosis not present

## 2020-09-23 DIAGNOSIS — R7303 Prediabetes: Secondary | ICD-10-CM | POA: Diagnosis not present

## 2020-09-23 DIAGNOSIS — F331 Major depressive disorder, recurrent, moderate: Secondary | ICD-10-CM | POA: Diagnosis not present

## 2020-10-13 DIAGNOSIS — D2371 Other benign neoplasm of skin of right lower limb, including hip: Secondary | ICD-10-CM | POA: Diagnosis not present

## 2020-10-20 DIAGNOSIS — I10 Essential (primary) hypertension: Secondary | ICD-10-CM | POA: Diagnosis not present

## 2020-10-20 DIAGNOSIS — R0602 Shortness of breath: Secondary | ICD-10-CM | POA: Diagnosis not present

## 2020-10-20 DIAGNOSIS — R079 Chest pain, unspecified: Secondary | ICD-10-CM | POA: Diagnosis not present

## 2020-10-20 DIAGNOSIS — J45909 Unspecified asthma, uncomplicated: Secondary | ICD-10-CM | POA: Diagnosis not present

## 2020-11-14 ENCOUNTER — Ambulatory Visit (INDEPENDENT_AMBULATORY_CARE_PROVIDER_SITE_OTHER): Payer: BC Managed Care – PPO | Admitting: Cardiology

## 2020-11-14 ENCOUNTER — Encounter: Payer: Self-pay | Admitting: Cardiology

## 2020-11-14 ENCOUNTER — Other Ambulatory Visit: Payer: Self-pay

## 2020-11-14 VITALS — BP 126/72 | HR 76 | Ht 65.0 in | Wt 293.0 lb

## 2020-11-14 DIAGNOSIS — R0683 Snoring: Secondary | ICD-10-CM

## 2020-11-14 DIAGNOSIS — I10 Essential (primary) hypertension: Secondary | ICD-10-CM | POA: Diagnosis not present

## 2020-11-14 DIAGNOSIS — R0609 Other forms of dyspnea: Secondary | ICD-10-CM

## 2020-11-14 DIAGNOSIS — R0600 Dyspnea, unspecified: Secondary | ICD-10-CM | POA: Diagnosis not present

## 2020-11-14 MED ORDER — TORSEMIDE 20 MG PO TABS
20.0000 mg | ORAL_TABLET | Freq: Every day | ORAL | 2 refills | Status: DC
Start: 2020-11-14 — End: 2020-12-12

## 2020-11-14 NOTE — Progress Notes (Signed)
Cardiology Office Note:    Date:  11/14/2020   ID:  [name], DOB [birth year], MRN [id number]  PCP:  Practice, [place] Cardiologist:  Kate Sable, MD  [place] Electrophysiologist:  None   Referring MD: Practice, [place]   Chief Complaint  Patient presents with  . New Patient (Initial Visit)    Referred by PCP for chest pain  Pt c/o chest pain--states it had been better since starting HCTZ, lost 11 lbs. She states it was sharp pain that went through to her back, accompanied by SOB and some pressure. Pt states she believes this was caused by fluid build-up.   Belinda [name] is a 40 y.o. female who is being seen today for the evaluation of chest pain at the request of Practice, [facility]*.   History of Present Illness:    Belinda [name] is a 40 y.o. female with a hx of hypertension, former smoker x19 years, obesity who presents due to chest pain, shortness of breath with exertion.  Patient states having sharp chest pains and swelling in her legs over the past month.  Saw her primary care provider who added HCTZ to her medications.  This significantly improved her swelling and also symptoms of sharp chest pains.  She sleeps in her recliner due to breathing issues, usually needs a wedge to sleep on her bed.  Also endorses shortness of breath with exertion.  She endorses snoring, daytime somnolence, daytime fatigue.  Echocardiogram 04/2018 showed normal systolic and diastolic function, EF [id number]  Past Medical History:  Diagnosis Date  . Allergic rhinitis   . Asthma    better after quitting smoking  . Depression with anxiety   . Elevated blood pressure reading    occasional elevated BPs; no medication yet  . Fibrocystic breast changes   . GERD (gastroesophageal reflux disease)   . History of echocardiogram    Echo 6/19:  Mild conc LVH, EF 65-70, normal wall motion, normal diastolic function, mild TR, PASP upper limits of normal  .  Hyperlipemia    managed with diet  . Muscle spasm of back   . Numbness and tingling of left lower extremity   . Obesity   . Panic disorder   . Prediabetes   . PVC's (premature ventricular contractions) 03/21/2007   has worn several monitors; 1st age 43  . Status post complete hysterectomy     Past Surgical History:  Procedure Laterality Date  . ABDOMINAL HYSTERECTOMY    . COLONOSCOPY WITH PROPOFOL N/A 06/03/2020   Procedure: COLONOSCOPY WITH PROPOFOL;  Surgeon: Virgel Manifold, MD;  Location: [facility] ENDOSCOPY;  Service: Endoscopy;  Laterality: N/A;  . DILATION AND CURETTAGE OF UTERUS      Current Medications: Current Meds  Medication Sig  . albuterol (VENTOLIN HFA) 108 (90 Base) MCG/ACT inhaler ProAir HFA 90 mcg/actuation aerosol inhaler  INHALE 1 TO 2 EVERY 4 TO 6 HOURS AS NEEDED WHEEZING  . ALPRAZolam (XANAX) 0.5 MG tablet Take 0.5 mg by mouth 3 (three) times daily as needed for anxiety.  Marland Kitchen aspirin EC 81 MG tablet Take 81 mg by mouth daily. Swallow whole.  . cyclobenzaprine (FLEXERIL) 5 MG tablet Take 1 tablet (5 mg total) by mouth 3 (three) times daily as needed.  Marland Kitchen FLUoxetine (PROZAC) 20 MG capsule Take 60 mg by mouth daily.  Marland Kitchen ibuprofen (ADVIL,MOTRIN) 200 MG tablet Take 400 mg by mouth every 6 (six) hours as needed for moderate pain.   Marland Kitchen  metoprolol succinate (TOPROL XL) 25 MG 24 hr tablet Take 1 tablet (25 mg total) by mouth daily.  . Omeprazole (PRILOSEC PO) Take 20 mg by mouth 1 day or 1 dose.   . torsemide (DEMADEX) 20 MG tablet Take 1 tablet (20 mg total) by mouth daily.  . [DISCONTINUED] hydrochlorothiazide (MICROZIDE) 12.5 MG capsule Take 12.5 mg by mouth daily.     Allergies:   Bupropion, Buspar [buspirone], Cymbalta [duloxetine hcl], Pristiq [desvenlafaxine], and Zoloft [sertraline]   Social History   Socioeconomic History  . Marital status: Married    Spouse name: Not on file  . Number of children: 2  . Years of education: GED  . Highest education level:  Not on file  Occupational History  . Occupation: Barista  Tobacco Use  . Smoking status: Former Smoker    Packs/day: 0.50    Types: Cigarettes  . Smokeless tobacco: Never Used  . Tobacco comment: Quit 2014  Vaping Use  . Vaping Use: Never used  Substance and Sexual Activity  . Alcohol use: No  . Drug use: No  . Sexual activity: Yes    Birth control/protection: Surgical  Other Topics Concern  . 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   Right-handed.   3 cups caffeine per day.   Social Determinants of Health   Financial Resource Strain: Not on file  Food Insecurity: Not on file  Transportation Needs: Not on file  Physical Activity: Not on file  Stress: Not on file  Social Connections: Not on file     Family History: The patient's family history includes ALS in her cousin, cousin, and paternal aunt; Arthritis in her mother; Breast cancer in her maternal grandmother; COPD in her father; Diabetes in her father; Heart attack in her maternal grandmother; Heart attack (age of onset: 16) in her father; Heart failure in her father; Stroke in her maternal grandmother, paternal grandfather, and paternal grandmother; Suicidality in her maternal grandfather.  ROS:   Please see the history of present illness.     All other systems reviewed and are negative.  EKGs/Labs/Other Studies Reviewed:    The following studies were reviewed today:   EKG:  EKG is  ordered today.  The ekg ordered today demonstrates normal sinus rhythm, normal ECG  Recent Labs: 06/04/2020: ALT 22; BUN 15; Creatinine, Ser 0.91; Platelets 214; Potassium 3.7; Sodium 135 06/05/2020: Hemoglobin 12.3  Recent Lipid Panel No results found for: CHOL, TRIG, HDL, CHOLHDL, VLDL, LDLCALC, LDLDIRECT   Risk Assessment/Calculations:      Physical Exam:    VS:  BP 126/72   Pulse 76   Ht 5\' 5"  (1.651  m)   Wt 293 lb (132.9 kg)   LMP  (LMP Unknown)   BMI 48.76 kg/m     Wt Readings from Last 3 Encounters:  11/14/20 293 lb (132.9 kg)  06/04/20 291 lb 0.1 oz (132 kg)  06/03/20 293 lb (132.9 kg)     GEN:  Well nourished, well developed in no acute distress HEENT: Normal NECK: No JVD; No carotid bruits LYMPHATICS: No lymphadenopathy CARDIAC: RRR, no murmurs, rubs, gallops RESPIRATORY:  Clear to auscultation without rales, wheezing or rhonchi  ABDOMEN: Soft, non-tender, distended MUSCULOSKELETAL:  1+ edema; No deformity  SKIN: Warm and dry NEUROLOGIC:  Alert and oriented x 3 PSYCHIATRIC:  Normal affect   ASSESSMENT:  1. Dyspnea on exertion   2. Primary hypertension   3. Morbid obesity (HCC)   4. Snoring    PLAN:    In order of problems listed above:  1. Patient with dyspnea on exertion.  Likely multifactorial from diastolic dysfunction or deconditioning/morbid obesity.  Sleep apnea could also be playing a role due to snoring and daytime somnolence.  Edema noted on exam.  Get echo to evaluate systolic and diastolic function.  Start torsemide 20 mg daily to improve absorption and diuresis.  Check BMP, fasting lipid profile in 1 week. 2. Hypertension, BP controlled.  Hold HCTZ with starting torsemide.  Continue Toprol-XL. 3. Patient is morbidly obese.  Low-calorie diet, weight loss advised. 4. Endorses snoring, daytime somnolence, fatigue.  Findings suggestive of OSA.  Refer to pulmonary medicine for OSA eval.  Follow-up in 3 to 4 weeks.  Medication Adjustments/Labs and Tests Ordered: Current medicines are reviewed at length with the patient today.  Concerns regarding medicines are outlined above.  Orders Placed This Encounter  Procedures  . Basic metabolic panel  . Lipid panel  . Ambulatory referral to Pulmonology  . EKG 12-Lead  . ECHOCARDIOGRAM COMPLETE   Meds ordered this encounter  Medications  . torsemide (DEMADEX) 20 MG tablet    Sig: Take 1 tablet (20 mg  total) by mouth daily.    Dispense:  30 tablet    Refill:  2    Patient Instructions  Medication Instructions:  Your physician has recommended you make the following change in your medication:   1) STOP HCTZ (hydrochlorithiazide).  2) START Torsemide 20 mg daily. An Rx has been sent to your pharmacy.  *If you need a refill on your cardiac medications before your next appointment, please call your pharmacy*   Lab Work: Your physician recommends that you return for a FASTING lipid profile and bmp in 1 week:  Please have your labs drawn at the [facility] medical mall lab. You do not need an appt. Lab hours are Mon-Fri 7am-6pm.  If you have labs (blood work) drawn today and your tests are completely normal, you will receive your results only by: Marland Kitchen MyChart Message (if you have MyChart) OR . A paper copy in the mail If you have any lab test that is abnormal or we need to change your treatment, we will call you to review the results.   Testing/Procedures: Your physician has requested that you have an echocardiogram. Echocardiography is a painless test that uses sound waves to create images of your heart. It provides your doctor with information about the size and shape of your heart and how well your heart's chambers and valves are working. This procedure takes approximately one hour. There are no restrictions for this procedure.     Follow-Up: At [facility], you and your health needs are our priority.  As part of our continuing mission to provide you with exceptional heart care, we have created designated Provider Care Teams.  These Care Teams include your primary Cardiologist (physician) and Advanced Practice Providers (APPs -  Physician Assistants and Nurse Practitioners) who all work together to provide you with the care you need, when you need it.  We recommend signing up for the patient portal called "MyChart".  Sign up information is provided on this After Visit Summary.  MyChart is  used to connect with patients for Virtual Visits (Telemedicine).  Patients are able to view lab/test results, encounter notes, upcoming appointments, etc.  Non-urgent messages can be sent to your  provider as well.   To learn more about what you can do with MyChart, go to [email or web address].    Your next appointment:   3-4 week(s)  The format for your next appointment:   In Person  Provider:   Kate Sable, MD   Other Instructions You have been referred to [facility] Pulmonology for a sleep apnea evaluation. Their office will contact you to schedule the appt.      Signed, Kate Sable, MD  11/14/2020 4:54 PM    [place]

## 2020-11-14 NOTE — Patient Instructions (Signed)
Medication Instructions:  Your physician has recommended you make the following change in your medication:   1) STOP HCTZ (hydrochlorithiazide).  2) START Torsemide 20 mg daily. An Rx has been sent to your pharmacy.  *If you need a refill on your cardiac medications before your next appointment, please call your pharmacy*   Lab Work: Your physician recommends that you return for a FASTING lipid profile and bmp in 1 week:  Please have your labs drawn at the [place] lab. You do not need an appt. Lab hours are Mon-Fri 7am-6pm.  If you have labs (blood work) drawn today and your tests are completely normal, you will receive your results only by: Marland Kitchen MyChart Message (if you have MyChart) OR . A paper copy in the mail If you have any lab test that is abnormal or we need to change your treatment, we will call you to review the results.   Testing/Procedures: Your physician has requested that you have an echocardiogram. Echocardiography is a painless test that uses sound waves to create images of your heart. It provides your doctor with information about the size and shape of your heart and how well your heart's chambers and valves are working. This procedure takes approximately one hour. There are no restrictions for this procedure.     Follow-Up: At [facility], you and your health needs are our priority.  As part of our continuing mission to provide you with exceptional heart care, we have created designated Provider Care Teams.  These Care Teams include your primary Cardiologist (physician) and Advanced Practice Providers (APPs -  Physician Assistants and Nurse Practitioners) who all work together to provide you with the care you need, when you need it.  We recommend signing up for the patient portal called "MyChart".  Sign up information is provided on this After Visit Summary.  MyChart is used to connect with patients for Virtual Visits (Telemedicine).  Patients are able to view  lab/test results, encounter notes, upcoming appointments, etc.  Non-urgent messages can be sent to your provider as well.   To learn more about what you can do with MyChart, go to [email or web address].    Your next appointment:   3-4 week(s)  The format for your next appointment:   In Person  Provider:   Kate Sable, MD   Other Instructions You have been referred to [facility] Pulmonology for a sleep apnea evaluation. Their office will contact you to schedule the appt.

## 2020-11-25 DIAGNOSIS — F411 Generalized anxiety disorder: Secondary | ICD-10-CM | POA: Diagnosis not present

## 2020-11-25 DIAGNOSIS — F332 Major depressive disorder, recurrent severe without psychotic features: Secondary | ICD-10-CM | POA: Diagnosis not present

## 2020-12-01 ENCOUNTER — Other Ambulatory Visit: Payer: Self-pay

## 2020-12-01 ENCOUNTER — Ambulatory Visit: Payer: BC Managed Care – PPO | Admitting: Primary Care

## 2020-12-01 ENCOUNTER — Encounter: Payer: Self-pay | Admitting: Primary Care

## 2020-12-01 VITALS — BP 122/78 | HR 80 | Temp 97.0°F | Ht 65.0 in | Wt 294.0 lb

## 2020-12-01 DIAGNOSIS — R0683 Snoring: Secondary | ICD-10-CM | POA: Diagnosis not present

## 2020-12-01 DIAGNOSIS — R0609 Other forms of dyspnea: Secondary | ICD-10-CM | POA: Insufficient documentation

## 2020-12-01 DIAGNOSIS — R0600 Dyspnea, unspecified: Secondary | ICD-10-CM | POA: Diagnosis not present

## 2020-12-01 NOTE — Assessment & Plan Note (Addendum)
-   Started on Torsemide 20mg  daily by cardiology, she could only tolerate 10mg  daily  - Repeat echocardiogram scheduled for tomorrow.  - If echo is normal would recommend repeating PFTs

## 2020-12-01 NOTE — Progress Notes (Signed)
@Patient  ID: Belinda [name], female    DOB: [birth year], 40 y.o.   MRN: [id number]  Chief Complaint  Patient presents with  . sleep consult    Per Dr. Arsenio Loader sleep study. Neg for OSA. C/o loud snoring, occ wakes gasping, restless sleep and daytime sleepiness x10y     Referring provider: Kate Sable, MD  HPI: 40 year old female, former smoker quit 2014 (20 pack year hx). PMH significant for allergic rhinitis, asthma, elevated BP readings, GERD, hyperlipidemia, colon polyp, neuropathy, abnormal glucose, obesity.  12/01/2020 Patient presents today for sleep consult. She previously had a sleep study in October 2016 at [facility] that did not show evidence of obstructive sleep apnea. She was re-referred by cardiology. During her last visit on 11/14/20 she was started on Torsemide 20mg  daily d/t edema and possible diastolic dysfunction. He felt she may have underlying sleep apnea. Reports loud snoring, daytime fatigue, daytime sleepiness and occ wakes up gasping for air. Her weight is up 40 lbs since her last sleep study. She is scheduled for repar echocardiogram tomorrow. She experienced leg, chest and stomach discomfort on 20mg  Torsemide. She self decreased dose to 10mg  daily. Leg swelling has improved. She mainly only experience dyspnea on exertion. She feels it is fluid related. She quit smoking 2014. Denies cough or wheezing.   Sleep Questionnaire Symptoms: Loud snoring, daytime fatigue, restless sleep, apnea Previous sleep study: October 2016, [facility] Typical bedtime: 1-2am Length of time to fall asleep: 10-65mins Nocturnal awakenings: 1-2 Out of bed in the morning: 9:30-10am Epworth Score: 5   Allergies  Allergen Reactions  . Bupropion Other (See Comments)    HEART PALPITATION, PAIN ATTACKS  . Buspar [Buspirone] Other (See Comments)    HEART PALPITATION, PAIN ATTACKS  . Cymbalta [Duloxetine Hcl] Other (See Comments)    HEART PALPITATION, PAIN ATTACKS   . Pristiq [Desvenlafaxine] Other (See Comments)    HEART PALPITATION, PAIN ATTACKS  . Zoloft [Sertraline] Other (See Comments)    HEART PALPITATION, PAIN ATTACKS     There is no immunization history on file for this patient.  Past Medical History:  Diagnosis Date  . Allergic rhinitis   . Asthma    better after quitting smoking  . Depression with anxiety   . Elevated blood pressure reading    occasional elevated BPs; no medication yet  . Fibrocystic breast changes   . GERD (gastroesophageal reflux disease)   . History of echocardiogram    Echo 6/19:  Mild conc LVH, EF 65-70, normal wall motion, normal diastolic function, mild TR, PASP upper limits of normal  . Hyperlipemia    managed with diet  . Muscle spasm of back   . Numbness and tingling of left lower extremity   . Obesity   . Panic disorder   . Prediabetes   . PVC's (premature ventricular contractions) 03/21/2007   has worn several monitors; 1st age 72  . Status post complete hysterectomy     Tobacco History: Social History   Tobacco Use  Smoking Status Former Smoker  . Packs/day: 1.00  . Years: 20.00  . Pack years: 20.00  . Types: Cigarettes  . Quit date: 2014  . Years since quitting: 8.0  Smokeless Tobacco Never Used   Counseling given: Not Answered   Outpatient Medications Prior to Visit  Medication Sig Dispense Refill  . albuterol (VENTOLIN HFA) 108 (90 Base) MCG/ACT inhaler ProAir HFA 90 mcg/actuation aerosol inhaler  INHALE 1 TO 2 EVERY 4 TO 6  HOURS AS NEEDED WHEEZING    . ALPRAZolam (XANAX) 0.5 MG tablet Take 0.5 mg by mouth 3 (three) times daily as needed for anxiety.    Marland Kitchen aspirin EC 81 MG tablet Take 81 mg by mouth daily. Swallow whole.    . cyclobenzaprine (FLEXERIL) 5 MG tablet Take 1 tablet (5 mg total) by mouth 3 (three) times daily as needed. 15 tablet 0  . FLUoxetine (PROZAC) 20 MG capsule Take 60 mg by mouth daily.    . metoprolol succinate (TOPROL XL) 25 MG 24 hr tablet Take 1 tablet  (25 mg total) by mouth daily. 90 tablet 3  . Omeprazole (PRILOSEC PO) Take 20 mg by mouth 1 day or 1 dose.     . torsemide (DEMADEX) 20 MG tablet Take 1 tablet (20 mg total) by mouth daily. 30 tablet 2  . ibuprofen (ADVIL,MOTRIN) 200 MG tablet Take 400 mg by mouth every 6 (six) hours as needed for moderate pain.      No facility-administered medications prior to visit.   Review of Systems  Review of Systems  Constitutional: Negative.   Respiratory: Negative for cough, chest tightness and wheezing.        DOE  Cardiovascular: Negative.    Physical Exam  BP 122/78 (BP Location: Left Arm, Cuff Size: Large)   Pulse 80   Temp (!) 97 F (36.1 C) (Temporal)   Ht 5\' 5"  (1.651 m)   Wt 294 lb (133.4 kg)   LMP  (LMP Unknown)   SpO2 98%   BMI 48.92 kg/m  Physical Exam Constitutional:      Appearance: Normal appearance.  HENT:     Mouth/Throat:     Mouth: Mucous membranes are moist.     Pharynx: Oropharynx is clear.     Comments: Mallampati class II-III Cardiovascular:     Rate and Rhythm: Normal rate and regular rhythm.  Pulmonary:     Effort: Pulmonary effort is normal.     Breath sounds: Normal breath sounds.     Comments: CTA Neurological:     General: No focal deficit present.     Mental Status: She is alert and oriented to person, place, and time. Mental status is at baseline.  Psychiatric:        Mood and Affect: Mood normal.        Behavior: Behavior normal.        Thought Content: Thought content normal.        Judgment: Judgment normal.      Lab Results:  CBC    Component Value Date/Time   WBC 11.1 (H) 06/04/2020 1932   RBC 4.40 06/04/2020 1932   HGB 12.3 06/05/2020 0413   HCT 38.2 06/05/2020 0413   PLT 214 06/04/2020 1932   MCV 83.6 06/04/2020 1932   MCH 28.2 06/04/2020 1932   MCHC 33.7 06/04/2020 1932   RDW 13.7 06/04/2020 1932   LYMPHSABS 2.5 12/09/2016 1513   MONOABS 0.7 12/09/2016 1513   EOSABS 0.1 12/09/2016 1513   BASOSABS 0.0 12/09/2016 1513     BMET    Component Value Date/Time   NA 135 06/04/2020 1932   NA 139 04/11/2018 1115   K 3.7 06/04/2020 1932   CL 100 06/04/2020 1932   CO2 25 06/04/2020 1932   GLUCOSE 110 (H) 06/04/2020 1932   BUN 15 06/04/2020 1932   BUN 10 04/11/2018 1115   CREATININE 0.91 06/04/2020 1932   CALCIUM 8.3 (L) 06/04/2020 1932   GFRNONAA >60 06/04/2020 1932  GFRAA >60 06/04/2020 1932    BNP No results found for: BNP  ProBNP No results found for: PROBNP  Imaging: No results found.   Assessment & Plan:   Snoring - Patient reports symptoms of loud snoring, daytime fatigue, restless sleep and waking up gasping for air. Referred by cardiology d/t dyspnea on exertion and borderline elevated PA pressure. Epworth 5/24. Moderate suspicion for underlying sleep apnea, needs home sleep study for evaluation.  - Discussed with patient that untreated sleep apnea puts patient at a higher risk for cardiovascular disease, cardiac arrhythmias, pulmonary hypertension, diabetes, stroke and increase in daytime accidents. We reviewed treatment options including weight loss, side sleeping position, oral appliance, CPAP or referral to ENT for possible surgical interventions - Advised patient not to drive if experiencing excessive daytime fatigue or somnolence - FU in 2-4 weeks after sleep study to review result and treatment options  Dyspnea on exertion - Started on Torsemide 20mg  daily by cardiology, she could only tolerate 10mg  daily  - Repeat echocardiogram scheduled for tomorrow.  - If echo is normal would recommend repeating PFTs     Martyn Ehrich, NP 12/01/2020

## 2020-12-01 NOTE — Assessment & Plan Note (Addendum)
-   Patient reports symptoms of loud snoring, daytime fatigue, restless sleep and waking up gasping for air. Referred by cardiology d/t dyspnea on exertion and borderline elevated PA pressure. Epworth 5/24. Moderate suspicion for underlying sleep apnea, needs home sleep study for evaluation.  - Discussed with patient that untreated sleep apnea puts patient at a higher risk for cardiovascular disease, cardiac arrhythmias, pulmonary hypertension, diabetes, stroke and increase in daytime accidents. We reviewed treatment options including weight loss, side sleeping position, oral appliance, CPAP or referral to ENT for possible surgical interventions - Advised patient not to drive if experiencing excessive daytime fatigue or somnolence - FU in 2-4 weeks after sleep study to review result and treatment options

## 2020-12-01 NOTE — Patient Instructions (Signed)
  Untreated sleep apnea puts you at high risk for cardiovascular disease, cardiac arrhythmias, pulmonary hypertension, stroke, diabetes, increased risk for daytime accidents   Treatment options for sleep apnea include weight loss, side sleeping position, oral appliance, CPAP, referral to ENT for possible surgical intervention   Do not drive if experiencing excessive daytime fatigue or somnolence  Continue to work on weight loss efforts   Orders: Home sleep study re: snoring   Follow-up: 2-4 week televisit to review sleep study results

## 2020-12-02 ENCOUNTER — Ambulatory Visit (INDEPENDENT_AMBULATORY_CARE_PROVIDER_SITE_OTHER): Payer: BC Managed Care – PPO

## 2020-12-02 ENCOUNTER — Other Ambulatory Visit
Admission: RE | Admit: 2020-12-02 | Discharge: 2020-12-02 | Disposition: A | Discharge status: Home / Self Care | Payer: BC Managed Care – PPO | Source: Ambulatory Visit | Attending: Cardiology | Admitting: Cardiology

## 2020-12-02 DIAGNOSIS — R0600 Dyspnea, unspecified: Secondary | ICD-10-CM | POA: Insufficient documentation

## 2020-12-02 DIAGNOSIS — R0609 Other forms of dyspnea: Secondary | ICD-10-CM

## 2020-12-02 DIAGNOSIS — I10 Essential (primary) hypertension: Secondary | ICD-10-CM | POA: Diagnosis not present

## 2020-12-02 LAB — ECHOCARDIOGRAM COMPLETE
Area-P 1/2: 2.95 cm2
S' Lateral: 2.9 cm
Single Plane A4C EF: 63.9 %

## 2020-12-02 LAB — LIPID PANEL
Cholesterol: 243 mg/dL — ABNORMAL HIGH (ref 0–200)
HDL: 44 mg/dL (ref >40)
LDL Cholesterol: 162 mg/dL — ABNORMAL HIGH (ref 0–99)
Total CHOL/HDL Ratio: 5.5 RATIO
Triglycerides: 183 mg/dL — ABNORMAL HIGH (ref <150)
VLDL: 37 mg/dL (ref 0–40)

## 2020-12-02 LAB — BASIC METABOLIC PANEL
Anion gap: 9 (ref 5–15)
BUN: 13 mg/dL (ref 6–20)
CO2: 28 mmol/L (ref 22–32)
Calcium: 7.7 mg/dL — ABNORMAL LOW (ref 8.9–10.3)
Chloride: 101 mmol/L (ref 98–111)
Creatinine, Ser: 0.96 mg/dL (ref 0.44–1.00)
GFR, Estimated: >60 mL/min (ref >60)
Glucose, Bld: 109 mg/dL — ABNORMAL HIGH (ref 70–99)
Potassium: 4.4 mmol/L (ref 3.5–5.1)
Sodium: 138 mmol/L (ref 135–145)

## 2020-12-02 MED ORDER — PERFLUTREN LIPID MICROSPHERE
2.0000 mL | INTRAVENOUS | Status: AC | PRN
  Administered 2020-12-02: 2 mL via INTRAVENOUS

## 2020-12-02 NOTE — Progress Notes (Signed)
Reviewed and agree with assessment/plan.   Tayah Idrovo, MD  Pulmonary/Critical Care 12/02/2020, 8:40 AM Pager:  [phone]  

## 2020-12-04 DIAGNOSIS — Z20822 Contact with and (suspected) exposure to covid-19: Secondary | ICD-10-CM | POA: Diagnosis not present

## 2020-12-04 DIAGNOSIS — Z03818 Encounter for observation for suspected exposure to other biological agents ruled out: Secondary | ICD-10-CM | POA: Diagnosis not present

## 2020-12-08 ENCOUNTER — Ambulatory Visit: Payer: BC Managed Care – PPO | Admitting: Cardiology

## 2020-12-12 ENCOUNTER — Encounter: Payer: Self-pay | Admitting: Cardiology

## 2020-12-12 ENCOUNTER — Ambulatory Visit: Payer: BC Managed Care – PPO | Admitting: Cardiology

## 2020-12-12 ENCOUNTER — Other Ambulatory Visit: Payer: Self-pay

## 2020-12-12 VITALS — BP 128/72 | HR 77 | Ht 65.0 in | Wt 290.0 lb

## 2020-12-12 DIAGNOSIS — R0609 Other forms of dyspnea: Secondary | ICD-10-CM

## 2020-12-12 DIAGNOSIS — R0600 Dyspnea, unspecified: Secondary | ICD-10-CM

## 2020-12-12 DIAGNOSIS — G4710 Hypersomnia, unspecified: Secondary | ICD-10-CM | POA: Diagnosis not present

## 2020-12-12 DIAGNOSIS — I10 Essential (primary) hypertension: Secondary | ICD-10-CM | POA: Diagnosis not present

## 2020-12-12 DIAGNOSIS — R0683 Snoring: Secondary | ICD-10-CM | POA: Diagnosis not present

## 2020-12-12 DIAGNOSIS — R7309 Other abnormal glucose: Secondary | ICD-10-CM

## 2020-12-12 MED ORDER — TORSEMIDE 10 MG PO TABS: 10.0000 mg | ORAL_TABLET | Freq: Every day | ORAL | 5 refills | Status: DC

## 2020-12-12 NOTE — Patient Instructions (Signed)
Medication Instructions:   Your physician has recommended you make the following change in your medication:   1.  STOP taking Hydrochlorothiazide. 2.  DECREASE your Torsemide to 10 MG: Take one tab by mouth daily.  *If you need a refill on your cardiac medications before your next appointment, please call your pharmacy*   Lab Work: None ordered If you have labs (blood work) drawn today and your tests are completely normal, you will receive your results only by: Marland Kitchen MyChart Message (if you have MyChart) OR . A paper copy in the mail If you have any lab test that is abnormal or we need to change your treatment, we will call you to review the results.   Testing/Procedures: None ordered   Follow-Up: At [facility], you and your health needs are our priority.  As part of our continuing mission to provide you with exceptional heart care, we have created designated Provider Care Teams.  These Care Teams include your primary Cardiologist (physician) and Advanced Practice Providers (APPs -  Physician Assistants and Nurse Practitioners) who all work together to provide you with the care you need, when you need it.  We recommend signing up for the patient portal called "MyChart".  Sign up information is provided on this After Visit Summary.  MyChart is used to connect with patients for Virtual Visits (Telemedicine).  Patients are able to view lab/test results, encounter notes, upcoming appointments, etc.  Non-urgent messages can be sent to your provider as well.   To learn more about what you can do with MyChart, go to [email or web address].    Your next appointment:   6 month(s)  The format for your next appointment:   In Person  Provider:   Kate Sable, MD   Other Instructions

## 2020-12-12 NOTE — Progress Notes (Signed)
Cardiology Office Note:    Date:  12/12/2020   ID:  Belinda [name], DOB 12/09/1980, MRN [id number]  PCP:  [facility] [place] Cardiologist:  Kate Sable, MD  [place] Electrophysiologist:  None   Referring MD: [facility], [place]   Chief Complaint  Patient presents with  . OTher    3-4 week follow up - patient c.o mild chest pain. Meds reviewed verbally with patient.       History of Present Illness:    Belinda [name] is a 40 y.o. female with a hx of hypertension, former smoker x19 years, obesity who presents for follow-up.  She was last seen due to shortness of breath with exertion.    Her symptoms were deemed likely from obesity and possible OSA.  She endorses daytime somnolence, fatigue.  Saw pulmonary medicine/sleep specialist.  Home sleep study is being planned.  She was started on torsemide which has helped her edema.  Higher dose of 20 mg because cramping.  She now takes 10 mg daily without symptoms.  Prior notes Echocardiogram 04/2018 showed normal systolic and diastolic function, EF 62%  Past Medical History:  Diagnosis Date  . Allergic rhinitis   . Asthma    better after quitting smoking  . Depression with anxiety   . Elevated blood pressure reading    occasional elevated BPs; no medication yet  . Fibrocystic breast changes   . GERD (gastroesophageal reflux disease)   . History of echocardiogram    Echo 6/19:  Mild conc LVH, EF 65-70, normal wall motion, normal diastolic function, mild TR, PASP upper limits of normal  . Hyperlipemia    managed with diet  . Muscle spasm of back   . Numbness and tingling of left lower extremity   . Obesity   . Panic disorder   . Prediabetes   . PVC's (premature ventricular contractions) 03/21/2007   has worn several monitors; 1st age 33  . Status post complete hysterectomy     Past Surgical History:  Procedure Laterality Date  . ABDOMINAL HYSTERECTOMY    . COLONOSCOPY WITH PROPOFOL  N/A 06/03/2020   Procedure: COLONOSCOPY WITH PROPOFOL;  Surgeon: Virgel Manifold, MD;  Location: [facility] ENDOSCOPY;  Service: Endoscopy;  Laterality: N/A;  . DILATION AND CURETTAGE OF UTERUS      Current Medications: Current Meds  Medication Sig  . albuterol (VENTOLIN HFA) 108 (90 Base) MCG/ACT inhaler ProAir HFA 90 mcg/actuation aerosol inhaler  INHALE 1 TO 2 EVERY 4 TO 6 HOURS AS NEEDED WHEEZING  . ALPRAZolam (XANAX) 0.5 MG tablet Take 0.5 mg by mouth 3 (three) times daily as needed for anxiety.  Marland Kitchen aspirin EC 81 MG tablet Take 81 mg by mouth daily. Swallow whole.  . cyclobenzaprine (FLEXERIL) 10 MG tablet Take 10 mg by mouth 3 (three) times daily.  . cyclobenzaprine (FLEXERIL) 5 MG tablet Take 1 tablet (5 mg total) by mouth 3 (three) times daily as needed.  Marland Kitchen FLUoxetine (PROZAC) 20 MG capsule Take 60 mg by mouth daily.  . metoprolol succinate (TOPROL XL) 25 MG 24 hr tablet Take 1 tablet (25 mg total) by mouth daily.  . Omeprazole (PRILOSEC PO) Take 20 mg by mouth 1 day or 1 dose.   . [DISCONTINUED] hydrochlorothiazide (HYDRODIURIL) 12.5 MG tablet Take 12.5 mg by mouth daily.  . [DISCONTINUED] torsemide (DEMADEX) 20 MG tablet Take 1 tablet (20 mg total) by mouth daily.     Allergies:   Bupropion, Buspar [buspirone], Cymbalta [  duloxetine hcl], Pristiq [desvenlafaxine], and Zoloft [sertraline]   Social History   Socioeconomic History  . Marital status: Married    Spouse name: Not on file  . Number of children: 2  . Years of education: GED  . Highest education level: Not on file  Occupational History  . Occupation: Barista  Tobacco Use  . Smoking status: Former Smoker    Packs/day: 1.00    Years: 20.00    Pack years: 20.00    Types: Cigarettes    Quit date: 2014    Years since quitting: 8.1  . Smokeless tobacco: Never Used  Vaping Use  . Vaping Use: Never used  Substance and Sexual Activity  . Alcohol use: No  . Drug use: No  . Sexual activity: Yes    Birth  control/protection: Surgical  Other Topics Concern  . 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   Right-handed.   3 cups caffeine per day.   Social Determinants of Health   Financial Resource Strain: Not on file  Food Insecurity: Not on file  Transportation Needs: Not on file  Physical Activity: Not on file  Stress: Not on file  Social Connections: Not on file     Family History: The patient's family history includes ALS in her cousin, cousin, and paternal aunt; Arthritis in her mother; Breast cancer in her maternal grandmother; COPD in her father; Diabetes in her father; Heart attack in her maternal grandmother; Heart attack (age of onset: 55) in her father; Heart failure in her father; Stroke in her maternal grandmother, paternal grandfather, and paternal grandmother; Suicidality in her maternal grandfather.  ROS:   Please see the history of present illness.     All other systems reviewed and are negative.  EKGs/Labs/Other Studies Reviewed:    The following studies were reviewed today:   EKG:  EKG is  ordered today.  The ekg ordered today demonstrates normal sinus rhythm  Recent Labs: 06/04/2020: ALT 22; Platelets 214 06/05/2020: Hemoglobin 12.3 12/02/2020: BUN 13; Creatinine, Ser 0.96; Potassium 4.4; Sodium 138  Recent Lipid Panel    Component Value Date/Time   CHOL 243 (H) 12/02/2020 1122   TRIG 183 (H) 12/02/2020 1122   HDL 44 12/02/2020 1122   CHOLHDL 5.5 12/02/2020 1122   VLDL 37 12/02/2020 1122   LDLCALC 162 (H) 12/02/2020 1122     Risk Assessment/Calculations:      Physical Exam:    VS:  BP 128/72 (BP Location: Left Arm, Patient Position: Sitting, Cuff Size: Large)   Pulse 77   Ht 5\' 5"  (1.651 m)   Wt 290 lb (131.5 kg)   LMP  (LMP Unknown)   SpO2 97%   BMI 48.26 kg/m     Wt Readings from Last 3 Encounters:  12/12/20 290 lb (131.5 kg)   12/01/20 294 lb (133.4 kg)  11/14/20 293 lb (132.9 kg)     GEN:  Well nourished, well developed in no acute distress HEENT: Normal NECK: No JVD; No carotid bruits LYMPHATICS: No lymphadenopathy CARDIAC: RRR, no murmurs, rubs, gallops RESPIRATORY:  Clear to auscultation without rales, wheezing or rhonchi  ABDOMEN: Soft, non-tender, distended MUSCULOSKELETAL: trace edema; No deformity  SKIN: Warm and dry NEUROLOGIC:  Alert and oriented x 3 PSYCHIATRIC:  Normal affect   ASSESSMENT:    1. Dyspnea on exertion   2. Primary  hypertension   3. Morbid obesity ([place])   4. Snoring   5. Abnormal glucose    PLAN:    In order of problems listed above:  1. Patient with dyspnea on exertion.  Echocardiogram showed normal systolic and diastolic function.  Creatinine normal on torsemide.  Symptoms likely from morbid obesity or possible OSA. 2. Hypertension, BP controlled. Continue Toprol-XL, torsemide. 3. Patient is morbidly obese.  Low-calorie diet, weight loss advised.  Will refer patient to nutritional medicine to help with weight loss. 4. snoring, daytime somnolence, fatigue.  Findings suggestive of OSA.  Refer to pulmonary medicine for OSA eval.  Follow-up in 6 months  Medication Adjustments/Labs and Tests Ordered: Current medicines are reviewed at length with the patient today.  Concerns regarding medicines are outlined above.  Orders Placed This Encounter  Procedures  . Ambulatory referral to Nutrition and Diabetic Education  . EKG 12-Lead   Meds ordered this encounter  Medications  . torsemide (DEMADEX) 10 MG tablet    Sig: Take 1 tablet (10 mg total) by mouth daily.    Dispense:  30 tablet    Refill:  5    Patient Instructions  Medication Instructions:   Your physician has recommended you make the following change in your medication:   1.  STOP taking Hydrochlorothiazide. 2.  DECREASE your Torsemide to 10 MG: Take one tab by mouth daily.  *If you need a refill on your  cardiac medications before your next appointment, please call your pharmacy*   Lab Work: None ordered If you have labs (blood work) drawn today and your tests are completely normal, you will receive your results only by: Marland Kitchen MyChart Message (if you have MyChart) OR . A paper copy in the mail If you have any lab test that is abnormal or we need to change your treatment, we will call you to review the results.   Testing/Procedures: None ordered   Follow-Up: At [facility], you and your health needs are our priority.  As part of our continuing mission to provide you with exceptional heart care, we have created designated Provider Care Teams.  These Care Teams include your primary Cardiologist (physician) and Advanced Practice Providers (APPs -  Physician Assistants and Nurse Practitioners) who all work together to provide you with the care you need, when you need it.  We recommend signing up for the patient portal called "MyChart".  Sign up information is provided on this After Visit Summary.  MyChart is used to connect with patients for Virtual Visits (Telemedicine).  Patients are able to view lab/test results, encounter notes, upcoming appointments, etc.  Non-urgent messages can be sent to your provider as well.   To learn more about what you can do with MyChart, go to [email or web address].    Your next appointment:   6 month(s)  The format for your next appointment:   In Person  Provider:   Kate Sable, MD   Other Instructions      Signed, Kate Sable, MD  12/12/2020 12:50 PM    [place]

## 2020-12-15 ENCOUNTER — Other Ambulatory Visit: Payer: Self-pay

## 2020-12-15 ENCOUNTER — Ambulatory Visit: Payer: BC Managed Care – PPO

## 2020-12-15 DIAGNOSIS — R0683 Snoring: Secondary | ICD-10-CM

## 2020-12-16 DIAGNOSIS — F411 Generalized anxiety disorder: Secondary | ICD-10-CM | POA: Diagnosis not present

## 2020-12-16 DIAGNOSIS — F332 Major depressive disorder, recurrent severe without psychotic features: Secondary | ICD-10-CM | POA: Diagnosis not present

## 2020-12-19 ENCOUNTER — Telehealth: Payer: Self-pay | Admitting: Primary Care

## 2020-12-19 NOTE — Telephone Encounter (Signed)
Called and spoke with pt about her visit which was scheduled Monday 2/21 for a televisit to discuss results of sleep study. Pt's appt has been rescheduled. Nothing further needed.

## 2020-12-19 NOTE — Telephone Encounter (Signed)
I have been out of the country until 2[place]15 and have been in the hospital for past several days since.  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

## 2020-12-19 NOTE — Telephone Encounter (Signed)
Pt is scheduled for a televisit Monday 2/21 with [name] to discuss sleep study results. Pt is a Belinda [name] pt. Sleep study results are not showing up in pt's chart.  Checked with [name], [facility] to see if she had sleep study results and she stated that pt just had sleep study performed 12/15/20.   Per [name], all [name] sleep study results are sent to Dr. Halford Chessman for him to review.  Dr. Halford Chessman, please advise if you have these results or if we should reschedule pt's follow up appt with [name] for a later day.

## 2020-12-22 ENCOUNTER — Ambulatory Visit: Payer: BC Managed Care – PPO | Admitting: Primary Care

## 2020-12-23 DIAGNOSIS — F332 Major depressive disorder, recurrent severe without psychotic features: Secondary | ICD-10-CM | POA: Diagnosis not present

## 2020-12-23 DIAGNOSIS — F411 Generalized anxiety disorder: Secondary | ICD-10-CM | POA: Diagnosis not present

## 2020-12-25 DIAGNOSIS — G4710 Hypersomnia, unspecified: Secondary | ICD-10-CM | POA: Diagnosis not present

## 2020-12-29 DIAGNOSIS — F411 Generalized anxiety disorder: Secondary | ICD-10-CM | POA: Diagnosis not present

## 2020-12-29 DIAGNOSIS — F332 Major depressive disorder, recurrent severe without psychotic features: Secondary | ICD-10-CM | POA: Diagnosis not present

## 2021-01-05 ENCOUNTER — Ambulatory Visit (INDEPENDENT_AMBULATORY_CARE_PROVIDER_SITE_OTHER): Payer: BC Managed Care – PPO | Admitting: Primary Care

## 2021-01-05 ENCOUNTER — Other Ambulatory Visit: Payer: Self-pay

## 2021-01-05 ENCOUNTER — Encounter: Payer: Self-pay | Admitting: Primary Care

## 2021-01-05 DIAGNOSIS — R0600 Dyspnea, unspecified: Secondary | ICD-10-CM | POA: Diagnosis not present

## 2021-01-05 DIAGNOSIS — R0609 Other forms of dyspnea: Secondary | ICD-10-CM

## 2021-01-05 DIAGNOSIS — E6609 Other obesity due to excess calories: Secondary | ICD-10-CM

## 2021-01-05 DIAGNOSIS — R0683 Snoring: Secondary | ICD-10-CM | POA: Diagnosis not present

## 2021-01-05 NOTE — Patient Instructions (Addendum)
  Home sleep study in February 2022 showed no significant evidence of sleep apnea.   Obesity: - Referring to medical weight management  Dyspnea on exertion: - Continue Torsemide 10mg  daily per cardiology - Echocardiogram showed normal systolic and diastolic function - I would recommend getting PFTs d/t smoking hx  Orders: - Pulmonary function testing 2-3 months with follow-up

## 2021-01-05 NOTE — Progress Notes (Signed)
Virtual Visit via Telephone Note  I connected with Belinda [name] on 01/05/21 at  3:00 PM EST by telephone and verified that I am speaking with the correct person using two identifiers.  Location: Patient: Home Provider: Office   I discussed the limitations, risks, security and privacy concerns of performing an evaluation and management service by telephone and the availability of in person appointments. I also discussed with the patient that there may be a patient responsible charge related to this service. The patient expressed understanding and agreed to proceed.   History of Present Illness:  40 year old female, former smoker quit 2014 (20 pack year hx). PMH significant for allergic rhinitis, asthma, elevated BP readings, GERD, hyperlipidemia, colon polyp, neuropathy, abnormal glucose, obesity.  12/01/2020 Patient presents today for sleep consult. She previously had a sleep study in October 2016 at [facility] that did not show evidence of obstructive sleep apnea. She was re-referred by cardiology. During her last visit on 11/14/20 she was started on Torsemide 20mg  daily d/t edema and possible diastolic dysfunction. He felt she may have underlying sleep apnea. Reports loud snoring, daytime fatigue, daytime sleepiness and occ wakes up gasping for air. Her weight is up 40 lbs since her last sleep study. She is scheduled for repeat echocardiogram tomorrow. She experienced leg, chest and stomach discomfort on 20mg  Torsemide. She self decreased dose to 10mg  daily. Leg swelling has improved. She mainly only experience dyspnea on exertion. She feels it is fluid related. She quit smoking 2014. Denies cough or wheezing.   Sleep Questionnaire Symptoms: Loud snoring, daytime fatigue, restless sleep, apnea Previous sleep study: October 2016, [facility] Typical bedtime: 1-2am Length of time to fall asleep: 10-58mins Nocturnal awakenings: 1-2 Out of bed in the morning: 9:30-10am Epworth Score:  5  01/05/2021- Interim  Patient contacted today to review sleep study results. HST 12/16/20 showed no significant evidence of sleep apnea,. average AHI score 2.7/hr with SpO2 low 90%. She is motivated to work on weight loss and is having a hard time getting nutrition consult. She would like to be referred to medical weight management. She follows with cardiology and is taking diuretic consistently as directed.     Observations/Objective:  HST 12/16/20>> AHI 2.7/hr with SpO2 low 90%  Assessment and Plan:  Snoring: - Home sleep study in February 2022 showed no significant evidence of sleep apnea. She had previous sleep study in 2016 at [facility] that was also negative for sleep apnea. I would not recommend further studies at this time  - Encourage weight loss and side sleeping position or elevating head of bed at night while sleeping  Obesity: - Referring to medical weight management  Dyspnea on exertion: - Continue Torsemide 10mg  daily per cardiology - Echocardiogram showed normal systolic and diastolic function - Dyspnea likely d/t BMI, however, I would recommend getting PFTs d/t smoking hx  Follow Up Instructions:  - PFTs 2-3 months    I discussed the assessment and treatment plan with the patient. The patient was provided an opportunity to ask questions and all were answered. The patient agreed with the plan and demonstrated an understanding of the instructions.   The patient was advised to call back or seek an in-person evaluation if the symptoms worsen or if the condition fails to improve as anticipated.  I provided 22 minutes of non-face-to-face time during this encounter.   Martyn Ehrich, NP

## 2021-01-05 NOTE — Progress Notes (Signed)
Reviewed and agree with assessment/plan.   Chesley Mires, MD [facility] Pulmonary/Critical Care 01/05/2021, 4:11 PM Pager:  [phone]

## 2021-01-13 DIAGNOSIS — F332 Major depressive disorder, recurrent severe without psychotic features: Secondary | ICD-10-CM | POA: Diagnosis not present

## 2021-01-13 DIAGNOSIS — F411 Generalized anxiety disorder: Secondary | ICD-10-CM | POA: Diagnosis not present

## 2021-01-23 IMAGING — CR DG ABDOMEN 1V
3 series · 3 of 3 positions shown · non-contrast
Comparison: None.

CLINICAL DATA: Left-sided abdominal pain and urinary incontinence
for 1 week.

EXAM:
ABDOMEN - 1 VIEW

[abdomen kub (1 of 3)]
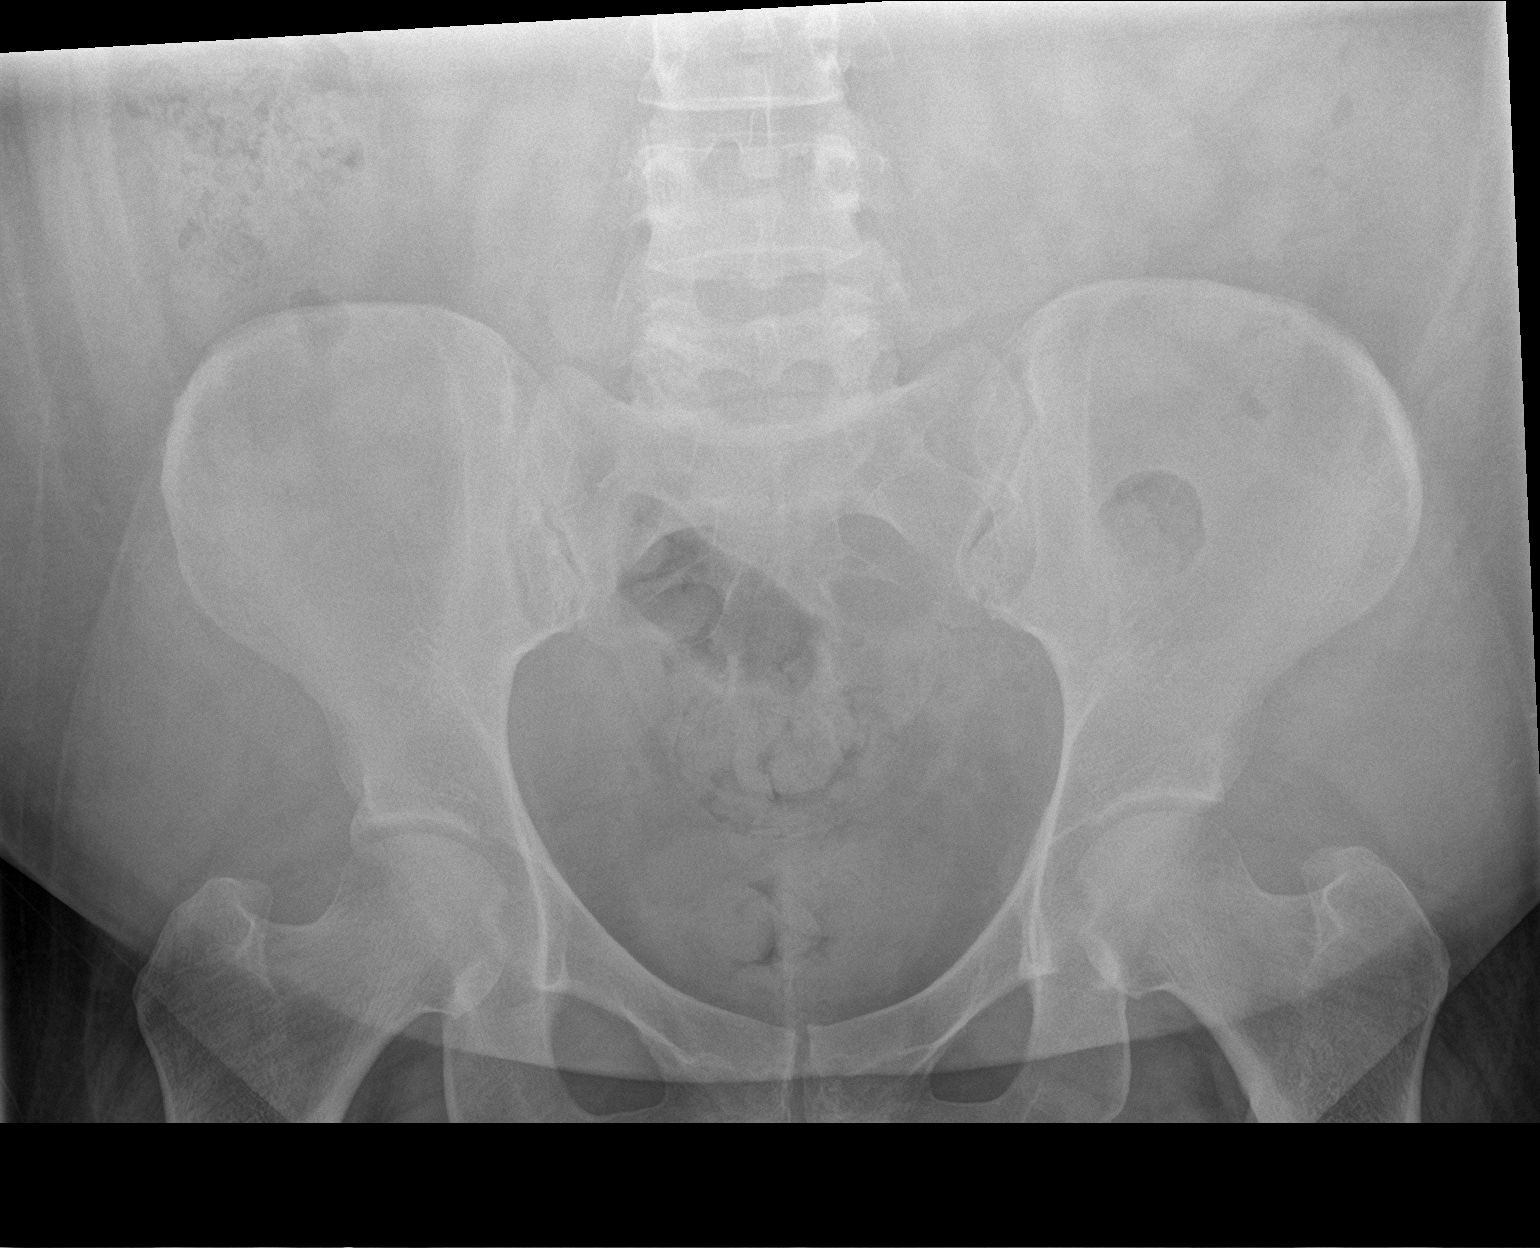

[abdomen kub (2 of 3)]
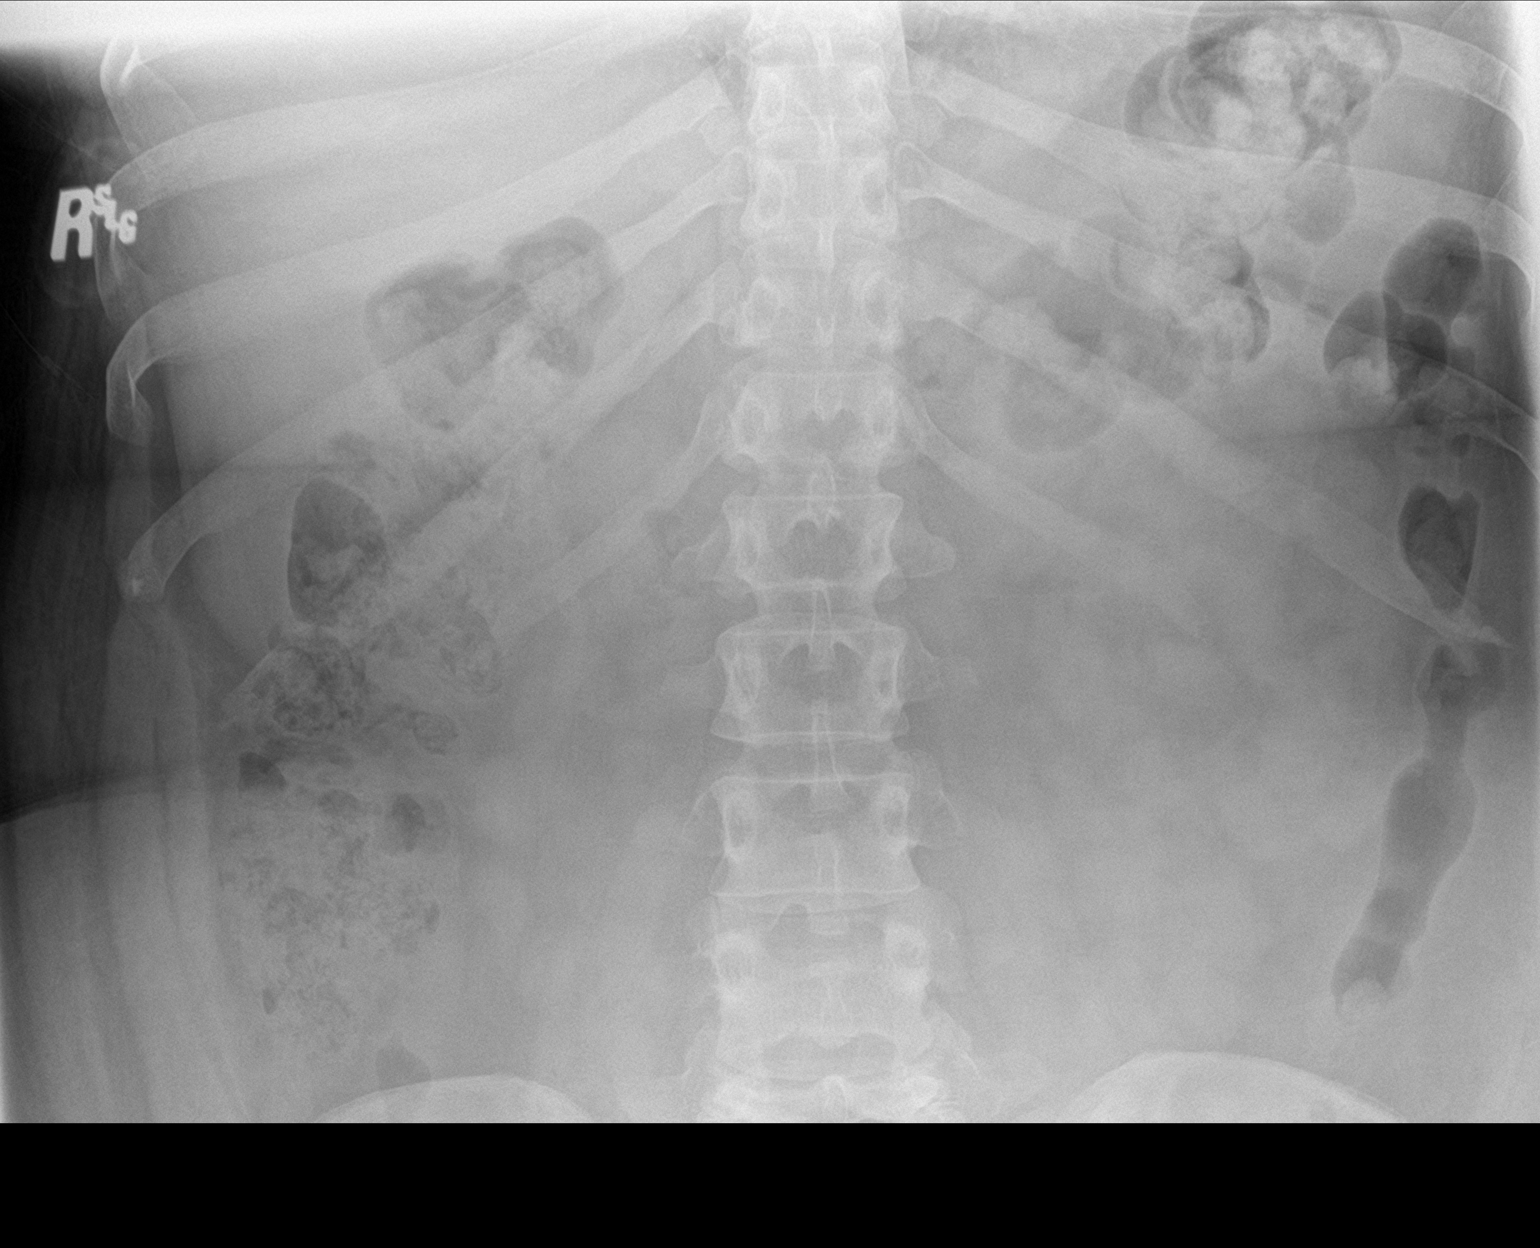

[abdomen kub (3 of 3)]
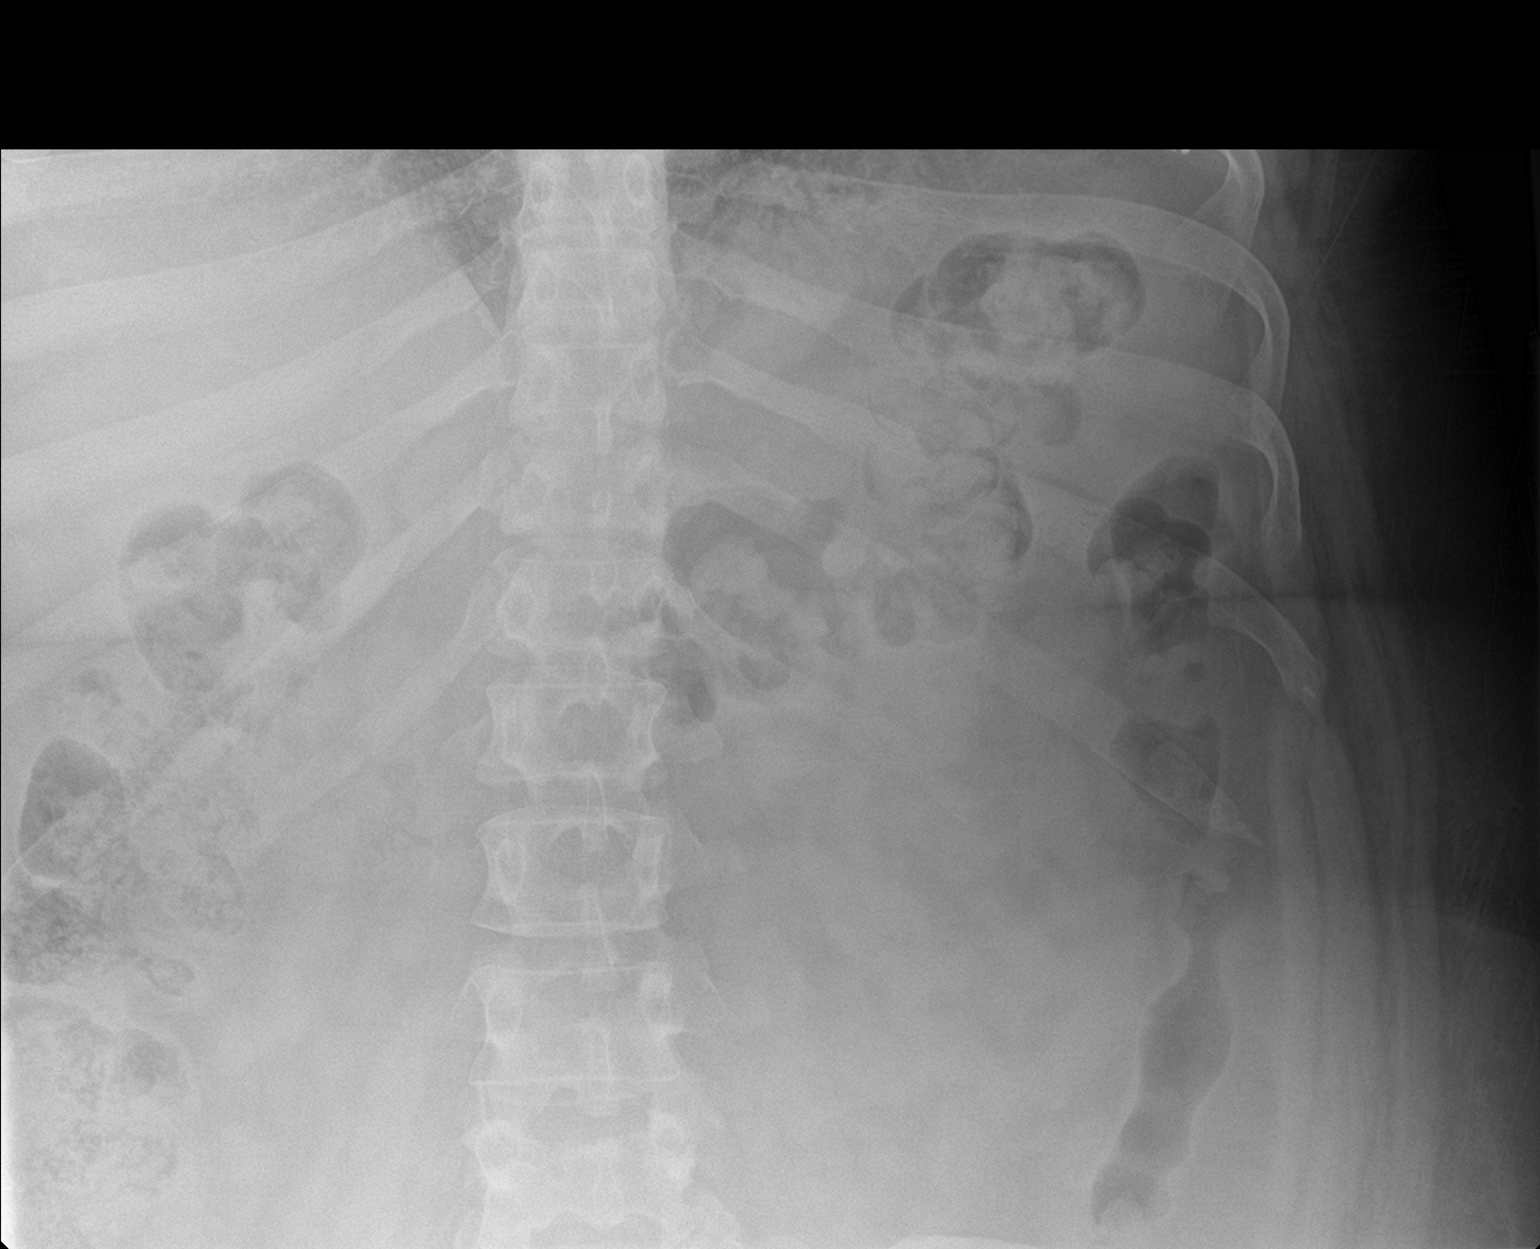

[3 of 3 positions shown; findings below may reference images not displayed]

FINDINGS: The bowel gas pattern is normal. No radio-opaque calculi or other
significant radiographic abnormality are seen.
IMPRESSION: Negative.

## 2021-01-26 DIAGNOSIS — J45901 Unspecified asthma with (acute) exacerbation: Secondary | ICD-10-CM | POA: Diagnosis not present

## 2021-01-26 DIAGNOSIS — J329 Chronic sinusitis, unspecified: Secondary | ICD-10-CM | POA: Diagnosis not present

## 2021-04-02 DIAGNOSIS — R7303 Prediabetes: Secondary | ICD-10-CM | POA: Diagnosis not present

## 2021-04-02 DIAGNOSIS — F331 Major depressive disorder, recurrent, moderate: Secondary | ICD-10-CM | POA: Diagnosis not present

## 2021-04-02 DIAGNOSIS — I10 Essential (primary) hypertension: Secondary | ICD-10-CM | POA: Diagnosis not present

## 2021-04-02 DIAGNOSIS — M62830 Muscle spasm of back: Secondary | ICD-10-CM | POA: Diagnosis not present

## 2021-05-06 DIAGNOSIS — Z23 Encounter for immunization: Secondary | ICD-10-CM | POA: Diagnosis not present

## 2021-05-11 ENCOUNTER — Ambulatory Visit
Admission: EM | Admit: 2021-05-11 | Discharge: 2021-05-11 | Disposition: A | Discharge status: Home / Self Care | Payer: BC Managed Care – PPO | Attending: Emergency Medicine | Admitting: Emergency Medicine

## 2021-05-11 ENCOUNTER — Other Ambulatory Visit: Payer: Self-pay

## 2021-05-11 ENCOUNTER — Encounter: Payer: Self-pay | Admitting: Emergency Medicine

## 2021-05-11 DIAGNOSIS — B349 Viral infection, unspecified: Secondary | ICD-10-CM | POA: Diagnosis not present

## 2021-05-11 LAB — POCT RAPID STREP A (OFFICE): Rapid Strep A Screen: NEGATIVE

## 2021-05-11 MED ORDER — LIDOCAINE VISCOUS HCL 2 % MT SOLN
15.0000 mL | OROMUCOSAL | 0 refills | Status: AC | PRN
Start: 2021-05-11 — End: 2021-05-18

## 2021-05-11 NOTE — ED Triage Notes (Signed)
Patient c/o sore throat x 2 days.  Patient denies fever at home.   Patient endorses generalized body aches and nasal congestion.   Patient endorses painful swallowing.   Patient has taken Tussinex with no relief of symptoms.

## 2021-05-11 NOTE — ED Provider Notes (Addendum)
CHIEF COMPLAINT:   Chief Complaint  Patient presents with   Sore Throat     SUBJECTIVE/HPI:   Sore Throat  [name] presents today with sore throat onset 2 days ago. Patient also reports associated body aches and nasal congestion. Patient reports that "it hurts to swallow" and reports seeing white and red streaks to her throat. Patient does not report any shortness of breath, chest pain, palpitations, visual changes, weakness, tingling, headache, nausea, vomiting, diarrhea, fever, chills   has a past medical history of Allergic rhinitis, Asthma, Depression with anxiety, Elevated blood pressure reading, Fibrocystic breast changes, GERD (gastroesophageal reflux disease), History of echocardiogram, Hyperlipemia, Muscle spasm of back, Numbness and tingling of left lower extremity, Obesity, Panic disorder, Prediabetes, PVC's (premature ventricular contractions) (03/21/2007), and Status post complete hysterectomy.  ROS:  Review of Systems See Subjective/HPI Medications, Allergies and Problem List personally reviewed in Epic today OBJECTIVE:   Vitals:   05/11/21 0925  BP: (!) 151/84  Pulse: 81  Resp: 18  Temp: 98.7 F (37.1 C)  SpO2: 92%    Physical Exam   General: Appears well-developed and well-nourished. No acute distress.  HEENT Head: Normocephalic and atraumatic.   Ears: Hearing grossly intact, no drainage or visible deformity.  Nose: No nasal deviation.   Mouth/Throat: No stridor or tracheal deviation.  Non erythematous posterior pharynx noted with clear drainage present.  No white patchy exudate noted. Eyes: Conjunctivae and EOM are normal. No eye drainage or scleral icterus bilaterally.  Neck: Normal range of motion, neck is supple. Cardiovascular: Normal rate. Regular rhythm; no murmurs, gallops, or rubs.  Pulm/Chest: No respiratory distress. Breath sounds normal bilaterally without wheezes, rhonchi, or rales.  Neurological: Alert and oriented to  person, place, and time.  Skin: Skin is warm and dry.  No rashes, lesions, abrasions or bruising noted to skin.   Psychiatric: Normal mood, affect, behavior, and thought content.   Vital signs and nursing note reviewed.   Patient stable and cooperative with examination. PROCEDURES:    LABS/X-RAYS/EKG/MEDS:   Results for orders placed or performed during the hospital encounter of 05/11/21  POCT rapid strep A  Result Value Ref Range   Rapid Strep A Screen Negative Negative   Strep negative COVID pending MEDICAL DECISION MAKING:   Patient presents with sore throat onset 2 days ago. Patient also reports associated body aches and nasal congestion. Patient reports that "it hurts to swallow" and reports seeing white and red streaks to her throat. Patient does not report any shortness of breath, chest pain, palpitations, visual changes, weakness, tingling, headache, nausea, vomiting, diarrhea, fever, chills. Chart review completed. Strep negative. COVID PCR pending. Given symptoms along with assessment findings, likely viral illness.  Rx'd viscous lidocaine. Low suspicion for underlying bacterial etiology with etiology which would require antibiotic etiology which would require antibiotics. Outlined at home treatment and are as outlined in the AVS. Work note provided.  Patient verbalized understanding and agreed with treatment plan.  Patient stable upon discharge. ASSESSMENT/PLAN:  1. Viral illness   Meds ordered this encounter  Medications   lidocaine (XYLOCAINE) 2 % solution    Sig: Use as directed 15 mLs in the mouth or throat as needed for up to 7 days for mouth pain.    Dispense:  100 mL    Refill:  0    Order Specific Question:   Supervising Provider    Answer:   Chase Picket [id number]    Instructions about new medications and  side effects provided.  Plan:   Discharge Instructions      We will call you with any positive results from your COVID-19 testing completed in  clinic today.  If you do not receive a phone call from [place] within the next 3-5 days, check your MyChart for up-to-date health information related to testing completed in clinic today.  For most people this is a self-limiting process and can take anywhere from 7 - 10 days to start feeling better. A cough can last up to 3 weeks. Pay special attention to handwashing as this can help prevent the spread of the virus.   Always read the labels of cough and cold medications as they may contain some of the ingredients below.  Rest, push lots of fluids (especially water), and utilize supportive care for symptoms. You may take acetaminophen (Tylenol) every 4-6 hours and ibuprofen every 6-8 hours for muscle pain, joint pain, headaches (you may also alternate these medications). Mucinex (guaifenesin) may be taken over the counter for cough as needed can loosen phlegm. Please read the instructions and take as directed.  Sudafed (pseudophedrine) is sold behind the counter and can help reduce nasal pressure; avoid taking this if you have high blood pressure or feel jittery. Sudafed PE (phenylephrine) can be a helpful, short-term, over-the-counter alternative to limit side effects or if you have high blood pressure.  Flonase nasal spray can help alleviate congestion and sinus pressure. Many patients choose Afrin as a nasal decongestant; do not use for more than 3 days for risk of rebound (increased symptoms after stopping medication).  Saline nasal sprays or rinses can also help nasal congestion (use bottled or sterile water). Warm tea with lemon and honey can sooth sore throat and cough, as can cough drops.   Return to clinic for high fever not improving with medications, chest pain, difficulty breathing, non-stop vomiting, or coughing blood. Follow-up with your primary care provider if symptoms do not improve as expected in the next 5-7 days.        A copy of these instructions have been given to the patient  or responsible adult who demonstrated the ability to learn, asked appropriate questions, and verbalized understanding of the plan of care.  There were no barriers to learning identified.    Serafina Royals, FNP-C 05/11/21  This note was partially made with the aid of speech-to-text dictation; typographical errors are not intentional.    Serafina Royals, [place] 05/11/21 [place], [place], [place] 05/11/21 1015

## 2021-05-11 NOTE — Discharge Instructions (Addendum)
We will call you with any positive results from your COVID-19 testing completed in clinic today.  If you do not receive a phone call from [place] within the next 3-5 days, check your MyChart for up-to-date health information related to testing completed in clinic today.  For most people this is a self-limiting process and can take anywhere from 7 - 10 days to start feeling better. A cough can last up to 3 weeks. Pay special attention to handwashing as this can help prevent the spread of the virus.   Always read the labels of cough and cold medications as they may contain some of the ingredients below.  Rest, push lots of fluids (especially water), and utilize supportive care for symptoms. You may take acetaminophen (Tylenol) every 4-6 hours and ibuprofen every 6-8 hours for muscle pain, joint pain, headaches (you may also alternate these medications). Mucinex (guaifenesin) may be taken over the counter for cough as needed can loosen phlegm. Please read the instructions and take as directed.  Sudafed (pseudophedrine) is sold behind the counter and can help reduce nasal pressure; avoid taking this if you have high blood pressure or feel jittery. Sudafed PE (phenylephrine) can be a helpful, short-term, over-the-counter alternative to limit side effects or if you have high blood pressure.  Flonase nasal spray can help alleviate congestion and sinus pressure. Many patients choose Afrin as a nasal decongestant; do not use for more than 3 days for risk of rebound (increased symptoms after stopping medication).  Saline nasal sprays or rinses can also help nasal congestion (use bottled or sterile water). Warm tea with lemon and honey can sooth sore throat and cough, as can cough drops.   Return to clinic for high fever not improving with medications, chest pain, difficulty breathing, non-stop vomiting, or coughing blood. Follow-up with your primary care provider if symptoms do not improve as expected in the next  5-7 days.

## 2021-05-12 LAB — NOVEL CORONAVIRUS, NAA: SARS-CoV-2, NAA: NOT DETECTED

## 2021-05-12 LAB — SARS-COV-2, NAA 2 DAY TAT

## 2021-06-05 ENCOUNTER — Other Ambulatory Visit: Payer: Self-pay

## 2021-06-05 ENCOUNTER — Ambulatory Visit: Payer: BC Managed Care – PPO | Admitting: Cardiology

## 2021-06-05 ENCOUNTER — Encounter: Payer: Self-pay | Admitting: Cardiology

## 2021-06-05 VITALS — BP 120/64 | HR 80 | Ht 65.0 in | Wt 289.0 lb

## 2021-06-05 DIAGNOSIS — I10 Essential (primary) hypertension: Secondary | ICD-10-CM | POA: Diagnosis not present

## 2021-06-05 DIAGNOSIS — R0609 Other forms of dyspnea: Secondary | ICD-10-CM

## 2021-06-05 DIAGNOSIS — R0600 Dyspnea, unspecified: Secondary | ICD-10-CM

## 2021-06-05 NOTE — Patient Instructions (Signed)

## 2021-06-05 NOTE — Progress Notes (Signed)
Cardiology Office Note:    Date:  06/05/2021   ID:  Belinda [name], DOB [birth year], MRN [id number]  PCP:  P[facility], [place] Cardiologist:  Kate Sable, MD  [place] Electrophysiologist:  None   Referring MD: [facility], [place]   Chief Complaint  Patient presents with   Other    6 month follow up. Patient c.o SOB when "getting up and moving around". Meds reviewed verbally with patient.       History of Present Illness:    Belinda [name] is a 40 y.o. female with a hx of hypertension, former smoker x19 years, morbid obesity who presents for follow-up.  She was last seen due to shortness of breath with exertion.  Endorsed snoring, was referred to pulmonary medicine for sleep apnea eval, sleep study apparently did not show sleep apnea.  On torsemide 10 mg daily which has helped with edema.  She still has shortness of breath with minimal exertion, also endorses sweating a lot with walking.  Denies chest pain has taken a weight loss pill in the past which was prescribed by primary care physician.  Was referred to nutritional medicine but insurance will not cover visit.   Prior notes Echocardiogram 04/2018 showed normal systolic and diastolic function, EF 123456  Past Medical History:  Diagnosis Date   Allergic rhinitis    Asthma    better after quitting smoking   Depression with anxiety    Elevated blood pressure reading    occasional elevated BPs; no medication yet   Fibrocystic breast changes    GERD (gastroesophageal reflux disease)    History of echocardiogram    Echo 6/19:  Mild conc LVH, EF 65-70, normal wall motion, normal diastolic function, mild TR, PASP upper limits of normal   Hyperlipemia    managed with diet   Muscle spasm of back    Numbness and tingling of left lower extremity    Obesity    Panic disorder    Prediabetes    PVC's (premature ventricular contractions) 03/21/2007   has worn several monitors; 1st age 17   Status  post complete hysterectomy     Past Surgical History:  Procedure Laterality Date   ABDOMINAL HYSTERECTOMY     COLONOSCOPY WITH PROPOFOL N/A 06/03/2020   Procedure: COLONOSCOPY WITH PROPOFOL;  Surgeon: Virgel Manifold, MD;  Location: [facility] ENDOSCOPY;  Service: Endoscopy;  Laterality: N/A;   DILATION AND CURETTAGE OF UTERUS      Current Medications: Current Meds  Medication Sig   albuterol (VENTOLIN HFA) 108 (90 Base) MCG/ACT inhaler ProAir HFA 90 mcg/actuation aerosol inhaler  INHALE 1 TO 2 EVERY 4 TO 6 HOURS AS NEEDED WHEEZING   ALPRAZolam (XANAX) 0.5 MG tablet Take 0.5 mg by mouth 3 (three) times daily as needed for anxiety.   aspirin EC 81 MG tablet Take 81 mg by mouth daily. Swallow whole.   cyclobenzaprine (FLEXERIL) 10 MG tablet Take 10 mg by mouth 3 (three) times daily.   FLUoxetine (PROZAC) 20 MG capsule Take 60 mg by mouth daily.   metoprolol succinate (TOPROL XL) 25 MG 24 hr tablet Take 1 tablet (25 mg total) by mouth daily.   Omeprazole (PRILOSEC PO) Take 20 mg by mouth 1 day or 1 dose.    torsemide (DEMADEX) 10 MG tablet Take 5 mg by mouth daily.     Allergies:   Bupropion, Buspar [buspirone], Cymbalta [duloxetine hcl], Pristiq [desvenlafaxine], and Zoloft [sertraline]   Social History  Socioeconomic History   Marital status: Married    Spouse name: Not on file   Number of children: 2   Years of education: GED   Highest education level: Not on file  Occupational History   Occupation: Barista  Tobacco Use   Smoking status: Former    Packs/day: 1.00    Years: 20.00    Pack years: 20.00    Types: Cigarettes    Quit date: 2014    Years since quitting: 8.5   Smokeless tobacco: Never  Vaping Use   Vaping Use: Never used  Substance and Sexual Activity   Alcohol use: No   Drug use: No   Sexual activity: Yes    Birth control/protection: Surgical  Other Topics Concern   Not on file  Social History Narrative   Originally from this area ([place], [place])    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   Right-handed.   3 cups caffeine per day.   Social Determinants of Health   Financial Resource Strain: Not on file  Food Insecurity: Not on file  Transportation Needs: Not on file  Physical Activity: Not on file  Stress: Not on file  Social Connections: Not on file     Family History: The patient's family history includes ALS in her cousin, cousin, and paternal aunt; Arthritis in her mother; Breast cancer in her maternal grandmother; COPD in her father; Diabetes in her father; Heart attack in her maternal grandmother; Heart attack (age of onset: 77) in her father; Heart failure in her father; Stroke in her maternal grandmother, paternal grandfather, and paternal grandmother; Suicidality in her maternal grandfather.  ROS:   Please see the history of present illness.     All other systems reviewed and are negative.  EKGs/Labs/Other Studies Reviewed:    The following studies were reviewed today:   EKG:  EKG is  ordered today.  The ekg ordered today demonstrates normal sinus rhythm  Recent Labs: 12/02/2020: BUN 13; Creatinine, Ser 0.96; Potassium 4.4; Sodium 138  Recent Lipid Panel    Component Value Date/Time   CHOL 243 (H) 12/02/2020 1122   TRIG 183 (H) 12/02/2020 1122   HDL 44 12/02/2020 1122   CHOLHDL 5.5 12/02/2020 1122   VLDL 37 12/02/2020 1122   LDLCALC 162 (H) 12/02/2020 1122     Risk Assessment/Calculations:      Physical Exam:    VS:  BP 120/64 (BP Location: Left Arm, Patient Position: Sitting, Cuff Size: Large)   Pulse 80   Ht '5\' 5"'$  (1.651 m)   Wt 289 lb (131.1 kg)   LMP  (LMP Unknown)   SpO2 96%   BMI 48.09 kg/m     Wt Readings from Last 3 Encounters:  06/05/21 289 lb (131.1 kg)  12/12/20 290 lb (131.5 kg)  12/01/20 294 lb (133.4 kg)     GEN:  Well nourished, well developed in no acute distress HEENT: Normal NECK: No JVD; No carotid bruits LYMPHATICS: No  lymphadenopathy CARDIAC: RRR, no murmurs, rubs, gallops RESPIRATORY:  Clear to auscultation without rales, wheezing or rhonchi  ABDOMEN: Soft, non-tender, distended MUSCULOSKELETAL: trace edema; No deformity  SKIN: Warm and dry NEUROLOGIC:  Alert and oriented x 3 PSYCHIATRIC:  Normal affect   ASSESSMENT:    1. Dyspnea on exertion   2. Primary hypertension   3. Morbid obesity ([place])     PLAN:    In order of problems listed above:  Patient  with dyspnea on exertion.  Echocardiogram showed normal systolic and diastolic function.  Symptoms likely from morbid obesity.  Sleep study did not reveal any significant OSA.  Continue torsemide 10 mg daily for edema.  Weight loss will be of much benefit.  Plans to follow-up with primary care provider regarding weight loss management. Hypertension, BP controlled. Continue Toprol-XL, torsemide. Patient is morbidly obese.  Low-calorie diet, weight loss advised.    Follow-up in 6 months  Medication Adjustments/Labs and Tests Ordered: Current medicines are reviewed at length with the patient today.  Concerns regarding medicines are outlined above.  Orders Placed This Encounter  Procedures   EKG 12-Lead    No orders of the defined types were placed in this encounter.   Patient Instructions  Medication Instructions:  Your physician recommends that you continue on your current medications as directed. Please refer to the Current Medication list given to you today.  *If you need a refill on your cardiac medications before your next appointment, please call your pharmacy*   Lab Work: None ordered If you have labs (blood work) drawn today and your tests are completely normal, you will receive your results only by: [place] (if you have MyChart) OR A paper copy in the mail If you have any lab test that is abnormal or we need to change your treatment, we will call you to review the results.   Testing/Procedures: None  ordered   Follow-Up: At [facility], you and your health needs are our priority.  As part of our continuing mission to provide you with exceptional heart care, we have created designated Provider Care Teams.  These Care Teams include your primary Cardiologist (physician) and Advanced Practice Providers (APPs -  Physician Assistants and Nurse Practitioners) who all work together to provide you with the care you need, when you need it.  We recommend signing up for the patient portal called "MyChart".  Sign up information is provided on this After Visit Summary.  MyChart is used to connect with patients for Virtual Visits (Telemedicine).  Patients are able to view lab/test results, encounter notes, upcoming appointments, etc.  Non-urgent messages can be sent to your provider as well.   To learn more about what you can do with MyChart, go to [email or web address].    Your next appointment:   6 month(s)  The format for your next appointment:   In Person  Provider:   Kate Sable, MD   Other Instructions    Signed, Kate Sable, MD  06/05/2021 4:15 PM    [place]

## 2021-06-09 DIAGNOSIS — Z111 Encounter for screening for respiratory tuberculosis: Secondary | ICD-10-CM | POA: Diagnosis not present

## 2021-06-29 ENCOUNTER — Other Ambulatory Visit: Payer: Self-pay | Admitting: Family Medicine

## 2021-06-29 DIAGNOSIS — R600 Localized edema: Secondary | ICD-10-CM | POA: Diagnosis not present

## 2021-06-29 DIAGNOSIS — R61 Generalized hyperhidrosis: Secondary | ICD-10-CM | POA: Diagnosis not present

## 2021-06-29 DIAGNOSIS — I10 Essential (primary) hypertension: Secondary | ICD-10-CM | POA: Diagnosis not present

## 2021-06-29 DIAGNOSIS — E782 Mixed hyperlipidemia: Secondary | ICD-10-CM | POA: Diagnosis not present

## 2021-06-29 DIAGNOSIS — Z0000 Encounter for general adult medical examination without abnormal findings: Secondary | ICD-10-CM | POA: Diagnosis not present

## 2021-06-29 DIAGNOSIS — Z79899 Other long term (current) drug therapy: Secondary | ICD-10-CM | POA: Diagnosis not present

## 2021-06-29 DIAGNOSIS — Z1231 Encounter for screening mammogram for malignant neoplasm of breast: Secondary | ICD-10-CM

## 2021-09-02 DIAGNOSIS — M549 Dorsalgia, unspecified: Secondary | ICD-10-CM | POA: Diagnosis not present

## 2021-09-02 DIAGNOSIS — Z6841 Body Mass Index (BMI) 40.0 and over, adult: Secondary | ICD-10-CM | POA: Diagnosis not present

## 2021-11-10 DIAGNOSIS — Z6841 Body Mass Index (BMI) 40.0 and over, adult: Secondary | ICD-10-CM | POA: Diagnosis not present

## 2021-11-10 DIAGNOSIS — R1902 Left upper quadrant abdominal swelling, mass and lump: Secondary | ICD-10-CM | POA: Diagnosis not present

## 2021-11-11 ENCOUNTER — Other Ambulatory Visit: Payer: Self-pay | Admitting: Family Medicine

## 2021-11-18 ENCOUNTER — Other Ambulatory Visit: Payer: Self-pay | Admitting: Family Medicine

## 2021-11-18 DIAGNOSIS — R1902 Left upper quadrant abdominal swelling, mass and lump: Secondary | ICD-10-CM

## 2021-11-25 ENCOUNTER — Ambulatory Visit
Admission: RE | Admit: 2021-11-25 | Discharge: 2021-11-25 | Disposition: A | Discharge status: Home / Self Care | Payer: BC Managed Care – PPO | Source: Ambulatory Visit | Attending: Family Medicine | Admitting: Family Medicine

## 2021-11-25 DIAGNOSIS — R1902 Left upper quadrant abdominal swelling, mass and lump: Secondary | ICD-10-CM

## 2021-12-07 ENCOUNTER — Encounter: Payer: Self-pay | Admitting: Cardiology

## 2021-12-07 ENCOUNTER — Ambulatory Visit: Payer: BC Managed Care – PPO | Admitting: Cardiology

## 2021-12-07 VITALS — BP 126/60 | HR 90 | Ht 65.0 in | Wt 296.0 lb

## 2021-12-07 DIAGNOSIS — I10 Essential (primary) hypertension: Secondary | ICD-10-CM | POA: Diagnosis not present

## 2021-12-07 DIAGNOSIS — R0609 Other forms of dyspnea: Secondary | ICD-10-CM

## 2021-12-07 NOTE — Patient Instructions (Signed)

## 2021-12-07 NOTE — Progress Notes (Signed)
Cardiology Office Note:    Date:  12/07/2021   ID:  Belinda [name], DOB 07-02-1981, MRN [id number]  PCP:  P[facility], [place] Cardiologist:  Kate Sable, MD  [place] Electrophysiologist:  None   Referring MD: Practice, [place]   Chief Complaint  Patient presents with   Other    6 month follow up -- Patient c.o sharpe pains in legs and Swelling. Meds reviewed verbally with patient.     History of Present Illness:    Belinda [name] is a 41 y.o. female with a hx of hypertension, former smoker x19 years, morbid obesity who presents for follow-up.    Previously seen for dyspnea on exertion.  Symptoms deemed secondary to deconditioning/morbid obesity.  Echo was normal.  Takes torsemide as needed for lower extremity edema, and also having more swelling in her legs of late.  States not taking torsemide daily as prescribed due to not being able to use the bathroom while at work.  Insurance previously did not approve visit for dietary/weight loss.  Planning on starting a new weight loss program after consulting with PCP tomorrow.  Otherwise doing okay, has no new concerns.  Prior notes Echo 12/2020 EF 60 to 14%, diastolic function normal. Echocardiogram 04/2018 showed normal systolic and diastolic function, EF 43%  Past Medical History:  Diagnosis Date   Allergic rhinitis    Asthma    better after quitting smoking   Depression with anxiety    Elevated blood pressure reading    occasional elevated BPs; no medication yet   Fibrocystic breast changes    GERD (gastroesophageal reflux disease)    History of echocardiogram    Echo 6/19:  Mild conc LVH, EF 65-70, normal wall motion, normal diastolic function, mild TR, PASP upper limits of normal   Hyperlipemia    managed with diet   Muscle spasm of back    Numbness and tingling of left lower extremity    Obesity    Panic disorder    Prediabetes    PVC's (premature ventricular contractions)  03/21/2007   has worn several monitors; 1st age 42   Status post complete hysterectomy     Past Surgical History:  Procedure Laterality Date   ABDOMINAL HYSTERECTOMY     COLONOSCOPY WITH PROPOFOL N/A 06/03/2020   Procedure: COLONOSCOPY WITH PROPOFOL;  Surgeon: Virgel Manifold, MD;  Location: [facility] ENDOSCOPY;  Service: Endoscopy;  Laterality: N/A;   DILATION AND CURETTAGE OF UTERUS      Current Medications: Current Meds  Medication Sig   albuterol (VENTOLIN HFA) 108 (90 Base) MCG/ACT inhaler ProAir HFA 90 mcg/actuation aerosol inhaler  INHALE 1 TO 2 EVERY 4 TO 6 HOURS AS NEEDED WHEEZING   ALPRAZolam (XANAX) 0.5 MG tablet Take 0.5 mg by mouth 3 (three) times daily as needed for anxiety.   aspirin EC 81 MG tablet Take 81 mg by mouth daily. Swallow whole.   cyclobenzaprine (FLEXERIL) 10 MG tablet Take 10 mg by mouth 3 (three) times daily.   FLUoxetine (PROZAC) 20 MG capsule Take 60 mg by mouth daily.   metoprolol succinate (TOPROL XL) 25 MG 24 hr tablet Take 1 tablet (25 mg total) by mouth daily.   Omeprazole (PRILOSEC PO) Take 20 mg by mouth 1 day or 1 dose.    torsemide (DEMADEX) 10 MG tablet Take 5 mg by mouth daily.     Allergies:   Bupropion, Buspar [buspirone], Cymbalta [duloxetine hcl], Pristiq [desvenlafaxine], and Zoloft [sertraline]  Social History   Socioeconomic History   Marital status: Married    Spouse name: Not on file   Number of children: 2   Years of education: GED   Highest education level: Not on file  Occupational History   Occupation: Barista  Tobacco Use   Smoking status: Former    Packs/day: 1.00    Years: 20.00    Pack years: 20.00    Types: Cigarettes    Quit date: 2014    Years since quitting: 9.1   Smokeless tobacco: Never  Vaping Use   Vaping Use: Never used  Substance and Sexual Activity   Alcohol use: No   Drug use: No   Sexual activity: Yes    Birth control/protection: Surgical  Other Topics Concern   Not on file   Social History Narrative   Originally from this area ([place], [place])   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   Right-handed.   3 cups caffeine per day.   Social Determinants of Health   Financial Resource Strain: Not on file  Food Insecurity: Not on file  Transportation Needs: Not on file  Physical Activity: Not on file  Stress: Not on file  Social Connections: Not on file     Family History: The patient's family history includes ALS in her cousin, cousin, and paternal aunt; Arthritis in her mother; Breast cancer in her maternal grandmother; COPD in her father; Diabetes in her father; Heart attack in her maternal grandmother; Heart attack (age of onset: 37) in her father; Heart failure in her father; Stroke in her maternal grandmother, paternal grandfather, and paternal grandmother; Suicidality in her maternal grandfather.  ROS:   Please see the history of present illness.     All other systems reviewed and are negative.  EKGs/Labs/Other Studies Reviewed:    The following studies were reviewed today:   EKG:  EKG is  ordered today.  The ekg ordered today demonstrates normal sinus rhythm, normal ECG  Recent Labs: No results found for requested labs within last 8760 hours.  Recent Lipid Panel    Component Value Date/Time   CHOL 243 (H) 12/02/2020 1122   TRIG 183 (H) 12/02/2020 1122   HDL 44 12/02/2020 1122   CHOLHDL 5.5 12/02/2020 1122   VLDL 37 12/02/2020 1122   LDLCALC 162 (H) 12/02/2020 1122     Risk Assessment/Calculations:      Physical Exam:    VS:  BP 126/60 (BP Location: Left Arm, Patient Position: Sitting, Cuff Size: Large)    Pulse 90    Ht 5\' 5"  (1.651 m)    Wt 296 lb (134.3 kg)    LMP  (LMP Unknown)    SpO2 95%    BMI 49.26 kg/m     Wt Readings from Last 3 Encounters:  12/07/21 296 lb (134.3 kg)  06/05/21 289 lb (131.1 kg)  12/12/20 290 lb (131.5 kg)     GEN:  Well nourished, well developed in no acute distress HEENT:  Normal NECK: No JVD; No carotid bruits CARDIAC: RRR, no murmurs, rubs, gallops RESPIRATORY:  Clear to auscultation without rales, wheezing or rhonchi  ABDOMEN: Soft, non-tender, distended MUSCULOSKELETAL: trace edema; No deformity  SKIN: Warm and dry NEUROLOGIC:  Alert and oriented x 3 PSYCHIATRIC:  Normal affect   ASSESSMENT:    1. Dyspnea on exertion   2. Primary hypertension   3. Morbid obesity ([place])    PLAN:  In order of problems listed above:  Patient with dyspnea on exertion.  Secondary to deconditioning/morbid obesity.  Echocardiogram showed normal systolic and diastolic function.   Continue torsemide as needed for edema.   Hypertension, BP controlled. Continue Toprol-XL, torsemide. Patient is morbidly obese.  Low-calorie diet, weight loss again advised.  Referral to nutritionist was previously given but insurance did not cover.  She plans to start a new dietary/weight loss program after talking with primary care provider.  Follow-up as needed  Medication Adjustments/Labs and Tests Ordered: Current medicines are reviewed at length with the patient today.  Concerns regarding medicines are outlined above.  No orders of the defined types were placed in this encounter.   No orders of the defined types were placed in this encounter.    There are no Patient Instructions on file for this visit.   Signed, Kate Sable, MD  12/07/2021 3:33 PM    [facility]

## 2021-12-23 DIAGNOSIS — R1012 Left upper quadrant pain: Secondary | ICD-10-CM | POA: Diagnosis not present

## 2021-12-28 ENCOUNTER — Other Ambulatory Visit: Payer: Self-pay | Admitting: General Surgery

## 2021-12-28 DIAGNOSIS — R1012 Left upper quadrant pain: Secondary | ICD-10-CM

## 2022-01-05 ENCOUNTER — Other Ambulatory Visit: Payer: BC Managed Care – PPO

## 2022-01-05 ENCOUNTER — Ambulatory Visit
Admission: RE | Admit: 2022-01-05 | Discharge: 2022-01-05 | Disposition: A | Discharge status: Home / Self Care | Payer: BC Managed Care – PPO | Source: Ambulatory Visit | Attending: General Surgery | Admitting: General Surgery

## 2022-01-05 DIAGNOSIS — R1012 Left upper quadrant pain: Secondary | ICD-10-CM | POA: Diagnosis not present

## 2022-01-05 DIAGNOSIS — K760 Fatty (change of) liver, not elsewhere classified: Secondary | ICD-10-CM | POA: Diagnosis not present

## 2022-01-05 DIAGNOSIS — Z90710 Acquired absence of both cervix and uterus: Secondary | ICD-10-CM | POA: Diagnosis not present

## 2022-01-05 MED ORDER — IOPAMIDOL (ISOVUE-300) INJECTION 61%
100.0000 mL | Freq: Once | INTRAVENOUS | Status: AC | PRN
  Administered 2022-01-05: 100 mL via INTRAVENOUS

## 2022-01-14 ENCOUNTER — Other Ambulatory Visit: Payer: Self-pay | Admitting: Family Medicine

## 2022-01-14 DIAGNOSIS — Z1231 Encounter for screening mammogram for malignant neoplasm of breast: Secondary | ICD-10-CM

## 2022-01-15 DIAGNOSIS — Z1231 Encounter for screening mammogram for malignant neoplasm of breast: Secondary | ICD-10-CM

## 2022-01-26 ENCOUNTER — Encounter: Payer: Self-pay | Admitting: Gastroenterology

## 2022-01-26 ENCOUNTER — Other Ambulatory Visit: Payer: Self-pay

## 2022-01-26 ENCOUNTER — Ambulatory Visit: Payer: BC Managed Care – PPO | Admitting: Gastroenterology

## 2022-01-26 DIAGNOSIS — M7918 Myalgia, other site: Secondary | ICD-10-CM | POA: Diagnosis not present

## 2022-01-26 NOTE — Patient Instructions (Signed)
You have been referred to  ? ?Dr Vashti Hey ?[facility] ?[address] ?[place], [place] [address] ?[phone] ?

## 2022-01-26 NOTE — Progress Notes (Signed)
? ? ?Primary Care Physician: Practice, [facility] ? ?Primary Gastroenterologist:  Dr. Lucilla Lame ? ?Chief Complaint  ?Patient presents with  ? New Patient (Initial Visit)  ?  Pt reports father had Hx of colon polyps, no other GI family Hx  ? Abdominal Pain  ?  LUQ x 4 months w/ radiation to upper left side of back... pt reports nausea and alternating diarrhea/constipation  ? ? ?HPI: Belinda [name] is a 41 y.o. female here who was seen by my partner in the past and comes in today with left upper quadrant pain.  The patient had seen surgery and they did a CT scan that did not show anything except fatty liver but did not see any cause for the patient's abdominal pain. The patient has had a colonoscopy in the past for constipation without any worrisome features except for an adenomatous polyp and a repeat recommendation for a colonoscopy 5 years.  The patient now reports that she has left-sided abdominal pain that is right under the ribs and radiates to the back.  She states it goes all the way to where she has a knot in her back.  The pain is worse when moving around and bending forward but better when she lays down.  She also denies any food or GI actions to make the abdominal pain any better or worse. The patient reports that she has gained a significant amount of weight and has been under a lot of stress.  She also reports that pushing on her abdomen makes her feel better.  There is no report of any black stools or bloody stools. She feels like the left side of her abdomen starts to quiver and feels like something is moving under her skin. ? ?Past Medical History:  ?Diagnosis Date  ? Allergic rhinitis   ? Asthma   ? better after quitting smoking  ? Depression with anxiety   ? Elevated blood pressure reading   ? occasional elevated BPs; no medication yet  ? Fibrocystic breast changes   ? GERD (gastroesophageal reflux disease)   ? History of echocardiogram   ? Echo 6/19:  Mild conc LVH, EF 65-70, normal  wall motion, normal diastolic function, mild TR, PASP upper limits of normal  ? Hyperlipemia   ? managed with diet  ? Muscle spasm of back   ? Numbness and tingling of left lower extremity   ? Obesity   ? Panic disorder   ? Prediabetes   ? PVC's (premature ventricular contractions) 03/21/2007  ? has worn several monitors; 1st age 48  ? Status post complete hysterectomy   ? ? ?Current Outpatient Medications  ?Medication Sig Dispense Refill  ? albuterol (VENTOLIN HFA) 108 (90 Base) MCG/ACT inhaler ProAir HFA 90 mcg/actuation aerosol inhaler ? INHALE 1 TO 2 EVERY 4 TO 6 HOURS AS NEEDED WHEEZING    ? ALPRAZolam (XANAX) 0.5 MG tablet Take 0.5 mg by mouth 3 (three) times daily as needed for anxiety.    ? aspirin EC 81 MG tablet Take 81 mg by mouth daily. Swallow whole.    ? CALCIUM-VITAMIN D PO Take by mouth.    ? cyclobenzaprine (FLEXERIL) 10 MG tablet Take 10 mg by mouth 3 (three) times daily.    ? FLUoxetine (PROZAC) 20 MG capsule Take 60 mg by mouth daily.    ? metoprolol succinate (TOPROL XL) 25 MG 24 hr tablet Take 1 tablet (25 mg total) by mouth daily. 90 tablet 3  ? Omeprazole (PRILOSEC  PO) Take 20 mg by mouth 1 day or 1 dose.     ? torsemide (DEMADEX) 10 MG tablet Take 5 mg by mouth daily.    ? vitamin B-12 (CYANOCOBALAMIN) 500 MCG tablet Take 500 mcg by mouth daily.    ? ?No current facility-administered medications for this visit.  ? ? ?Allergies as of 01/26/2022 - Review Complete 12/07/2021  ?Allergen Reaction Noted  ? Bupropion Other (See Comments) 03/20/2018  ? Buspar [buspirone] Other (See Comments) 03/20/2018  ? Cymbalta [duloxetine hcl] Other (See Comments) 03/20/2018  ? Pristiq [desvenlafaxine] Other (See Comments) 03/20/2018  ? Zoloft [sertraline] Other (See Comments) 03/20/2018  ? ? ?ROS: ? ?General: Negative for anorexia, weight loss, fever, chills, fatigue, weakness. ?ENT: Negative for hoarseness, difficulty swallowing , nasal congestion. ?CV: Negative for chest pain, angina, palpitations, dyspnea on  exertion, peripheral edema.  ?Respiratory: Negative for dyspnea at rest, dyspnea on exertion, cough, sputum, wheezing.  ?GI: See history of present illness. ?GU:  Negative for dysuria, hematuria, urinary incontinence, urinary frequency, nocturnal urination.  ?Endo: Negative for unusual weight change.  ?  ?Physical Examination: ? ? LMP  (LMP Unknown)  ? ?General: Well-nourished, well-developed in no acute distress.  ?Eyes: No icterus. Conjunctivae pink. ?Lungs: Clear to auscultation bilaterally. Non-labored. ?Heart: Regular rate and rhythm, no murmurs rubs or gallops.  ?Abdomen: Bowel sounds are normal, nontender, nondistended, no hepatosplenomegaly or masses, no abdominal bruits or hernia , no rebound or guarding.   ?Extremities: No lower extremity edema. No clubbing or deformities. ?Neuro: Alert and oriented x 3.  Grossly intact. ?Skin: Warm and dry, no jaundice.   ?Psych: Alert and cooperative, normal mood and affect. ? ?Labs:  ?  ?Imaging Studies: ?CT ABDOMEN PELVIS W CONTRAST ? ?Result Date: 01/05/2022 ?CLINICAL DATA:  Left upper quadrant abdominal pain EXAM: CT ABDOMEN AND PELVIS WITH CONTRAST TECHNIQUE: Multidetector CT imaging of the abdomen and pelvis was performed using the standard protocol following bolus administration of intravenous contrast. RADIATION DOSE REDUCTION: This exam was performed according to the departmental dose-optimization program which includes automated exposure control, adjustment of the mA and/or kV according to patient size and/or use of iterative reconstruction technique. CONTRAST:  168m ISOVUE-300 IOPAMIDOL (ISOVUE-300) INJECTION 61% COMPARISON:  Abdominal ultrasound 11/25/2021 FINDINGS: Lower chest: The lung bases are clear without focal nodule, mass, or airspace disease. Heart size is normal. No significant pleural or pericardial effusion is present. Hepatobiliary: Diffuse fatty infiltration liver is present. No focal lesions are present. Common bile duct and gallbladder are  within normal limits. Pancreas: Unremarkable. No pancreatic ductal dilatation or surrounding inflammatory changes. Spleen: Normal in size without focal abnormality. Adrenals/Urinary Tract: Adrenal glands are within normal limits bilaterally. Kidneys are unremarkable. No stone or mass lesion is present. No obstruction is present. Ureters are within normal limits bilaterally. The urinary bladder is within normal limits. Stomach/Bowel: Stomach and duodenum are within normal limits. Small bowel is unremarkable. Terminal ileum is within normal limits. Appendix is visualized and normal. The ascending and transverse colon are within normal limits. Descending and sigmoid colon are normal. Vascular/Lymphatic: No significant vascular findings are present. No enlarged abdominal or pelvic lymph nodes. Reproductive: Status post hysterectomy. No adnexal masses. Other: No abdominal wall hernia or abnormality. No abdominopelvic ascites. Musculoskeletal: No acute or significant osseous findings. IMPRESSION: 1. No acute or focal lesion to explain the patient's left upper quadrant abdominal pain. 2. Hepatic steatosis. Electronically Signed   By: CSan MorelleM.D.   On: 01/05/2022 21:46   ? ?Assessment and  Plan:  ? ?Belinda [name] is a 41 y.o. y/o female who comes in today with abdominal wall pain in the left side that radiates to the back.  Although she presently reports that she is having significant amounts of pain there is no pain that is reproducible by palpating anywhere on the abdomen.  Despite the patient's history and report of things that make her abdominal pain better or worse sounding very similar to muscular pain there is no reproducible tenderness with relaxation of the abdominal wall muscles or with flexion of the abdominal wall muscles.  The patient has been told that this is likely referred pain from her back which is tender to palpation.  The patient has a already been taking Flexeril and has tried NSAIDs without  any relief.  The patient has been explained my findings and that her CT scan was negative.  The patient will be set up to see pain management for possible injections to help with her pain.  The patient has

## 2022-02-03 DIAGNOSIS — E7800 Pure hypercholesterolemia, unspecified: Secondary | ICD-10-CM | POA: Diagnosis not present

## 2022-02-03 DIAGNOSIS — I10 Essential (primary) hypertension: Secondary | ICD-10-CM | POA: Diagnosis not present

## 2022-02-03 DIAGNOSIS — R7303 Prediabetes: Secondary | ICD-10-CM | POA: Diagnosis not present

## 2022-03-10 ENCOUNTER — Telehealth: Payer: Self-pay | Admitting: Cardiology

## 2022-03-10 MED ORDER — TORSEMIDE 10 MG PO TABS: 5.0000 mg | ORAL_TABLET | Freq: Every day | ORAL | 0 refills | Status: AC

## 2022-03-10 NOTE — Telephone Encounter (Signed)
?*  STAT* If patient is at the pharmacy, call can be transferred to refill team. ? ? ?1. Which medications need to be refilled? (please list name of each medication and dose if known)  ?torsemide (DEMADEX) 10 MG tablet ? ?2. Which pharmacy/location (including street and city if local pharmacy) is medication to be sent to? ?[place] ([place] [place] ? ?3. Do they need a 30 day or 90 day supply?  ?90 day supply ? ?

## 2022-03-10 NOTE — Telephone Encounter (Signed)
Requested Prescriptions  ? ?Signed Prescriptions Disp Refills  ? torsemide (DEMADEX) 10 MG tablet 45 tablet 0  ?  Sig: Take 0.5 tablets (5 mg total) by mouth daily.  ?  Authorizing Provider: Kate Sable  ?  Ordering User: NEWCOMER MCCLAIN, Daxx Tiggs L  ? ? ?
